# Patient Record
Sex: Female | Born: 1950 | Race: White | Hispanic: No | Marital: Married | Smoking: Never smoker
Health system: Southern US, Community
[De-identification: ages and names within clinical notes are randomized; demographics above are authoritative.]

## PROBLEM LIST (undated history)

## (undated) DIAGNOSIS — E039 Hypothyroidism, unspecified: Secondary | ICD-10-CM

## (undated) DIAGNOSIS — T783XXA Angioneurotic edema, initial encounter: Secondary | ICD-10-CM

## (undated) DIAGNOSIS — L501 Idiopathic urticaria: Secondary | ICD-10-CM

## (undated) DIAGNOSIS — I2699 Other pulmonary embolism without acute cor pulmonale: Secondary | ICD-10-CM

## (undated) DIAGNOSIS — F419 Anxiety disorder, unspecified: Secondary | ICD-10-CM

## (undated) DIAGNOSIS — C8190 Hodgkin lymphoma, unspecified, unspecified site: Secondary | ICD-10-CM

## (undated) DIAGNOSIS — I82409 Acute embolism and thrombosis of unspecified deep veins of unspecified lower extremity: Secondary | ICD-10-CM

## (undated) DIAGNOSIS — K760 Fatty (change of) liver, not elsewhere classified: Secondary | ICD-10-CM

## (undated) HISTORY — PX: APPENDECTOMY: SHX54

## (undated) HISTORY — DX: Acute embolism and thrombosis of unspecified deep veins of unspecified lower extremity: I82.409

## (undated) HISTORY — DX: Fatty (change of) liver, not elsewhere classified: K76.0

## (undated) HISTORY — DX: Idiopathic urticaria: L50.1

## (undated) HISTORY — DX: Other pulmonary embolism without acute cor pulmonale: I26.99

## (undated) HISTORY — DX: Angioneurotic edema, initial encounter: T78.3XXA

## (undated) HISTORY — PX: HERNIA REPAIR: SHX51

## (undated) HISTORY — DX: Hypothyroidism, unspecified: E03.9

## (undated) HISTORY — PX: SPLENECTOMY: SUR1306

## (undated) HISTORY — DX: Anxiety disorder, unspecified: F41.9

## (undated) HISTORY — DX: Hodgkin lymphoma, unspecified, unspecified site: C81.90

---

## 1983-03-01 HISTORY — PX: TUBAL LIGATION: SHX77

## 2004-06-29 ENCOUNTER — Inpatient Hospital Stay (HOSPITAL_COMMUNITY): Admission: EM | Admit: 2004-06-29 | Discharge: 2004-07-05 | Payer: Self-pay | Admitting: Emergency Medicine

## 2005-05-05 ENCOUNTER — Encounter: Admission: RE | Admit: 2005-05-05 | Discharge: 2005-05-05 | Payer: Self-pay | Admitting: Obstetrics and Gynecology

## 2006-01-23 ENCOUNTER — Other Ambulatory Visit: Admission: RE | Admit: 2006-01-23 | Discharge: 2006-01-23 | Payer: Self-pay | Admitting: Obstetrics & Gynecology

## 2006-03-09 ENCOUNTER — Encounter: Admission: RE | Admit: 2006-03-09 | Discharge: 2006-03-09 | Payer: Self-pay | Admitting: Cardiology

## 2006-03-14 ENCOUNTER — Ambulatory Visit (HOSPITAL_COMMUNITY): Admission: RE | Admit: 2006-03-14 | Discharge: 2006-03-14 | Payer: Self-pay | Admitting: Cardiology

## 2006-03-24 ENCOUNTER — Ambulatory Visit (HOSPITAL_COMMUNITY): Admission: RE | Admit: 2006-03-24 | Discharge: 2006-03-24 | Payer: Self-pay | Admitting: Cardiology

## 2006-05-08 ENCOUNTER — Encounter: Admission: RE | Admit: 2006-05-08 | Discharge: 2006-05-08 | Payer: Self-pay | Admitting: Obstetrics & Gynecology

## 2007-02-15 ENCOUNTER — Other Ambulatory Visit: Admission: RE | Admit: 2007-02-15 | Discharge: 2007-02-15 | Payer: Self-pay | Admitting: Obstetrics & Gynecology

## 2007-07-05 ENCOUNTER — Encounter: Admission: RE | Admit: 2007-07-05 | Discharge: 2007-07-05 | Payer: Self-pay | Admitting: Obstetrics & Gynecology

## 2007-08-29 HISTORY — PX: MELANOMA EXCISION: SHX5266

## 2008-03-31 ENCOUNTER — Other Ambulatory Visit: Admission: RE | Admit: 2008-03-31 | Discharge: 2008-03-31 | Payer: Self-pay | Admitting: Obstetrics & Gynecology

## 2008-07-14 ENCOUNTER — Encounter: Admission: RE | Admit: 2008-07-14 | Discharge: 2008-07-14 | Payer: Self-pay | Admitting: Obstetrics & Gynecology

## 2009-08-07 ENCOUNTER — Encounter: Admission: RE | Admit: 2009-08-07 | Discharge: 2009-08-07 | Payer: Self-pay | Admitting: Obstetrics & Gynecology

## 2009-08-25 ENCOUNTER — Encounter: Admission: RE | Admit: 2009-08-25 | Discharge: 2009-08-25 | Payer: Self-pay | Admitting: Obstetrics & Gynecology

## 2010-02-02 IMAGING — MG MM SCREEN MAMMOGRAM BILATERAL
4 series · 4 of 4 positions shown · non-contrast
Comparison: none

DG SCREEN MAMMOGRAM BILATERAL
Bilateral CC and MLO view(s) were taken.

DIGITAL SCREENING MAMMOGRAM WITH CAD:
The breast tissue is almost entirely fatty.  No masses or malignant type calcifications are 
identified.  Compared with prior studies.

[R CC]
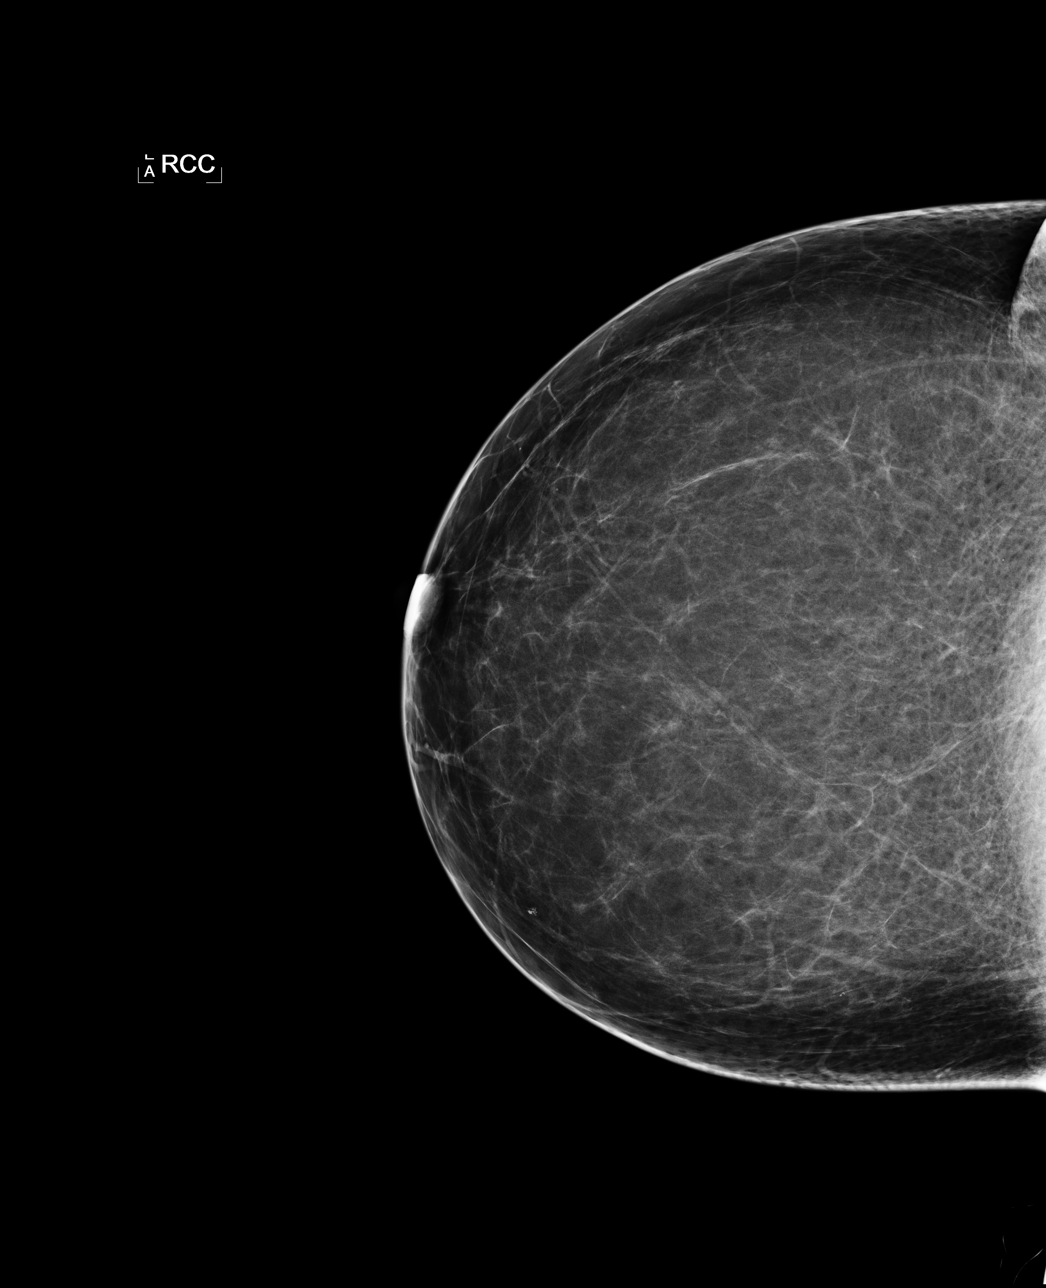

[L CC]
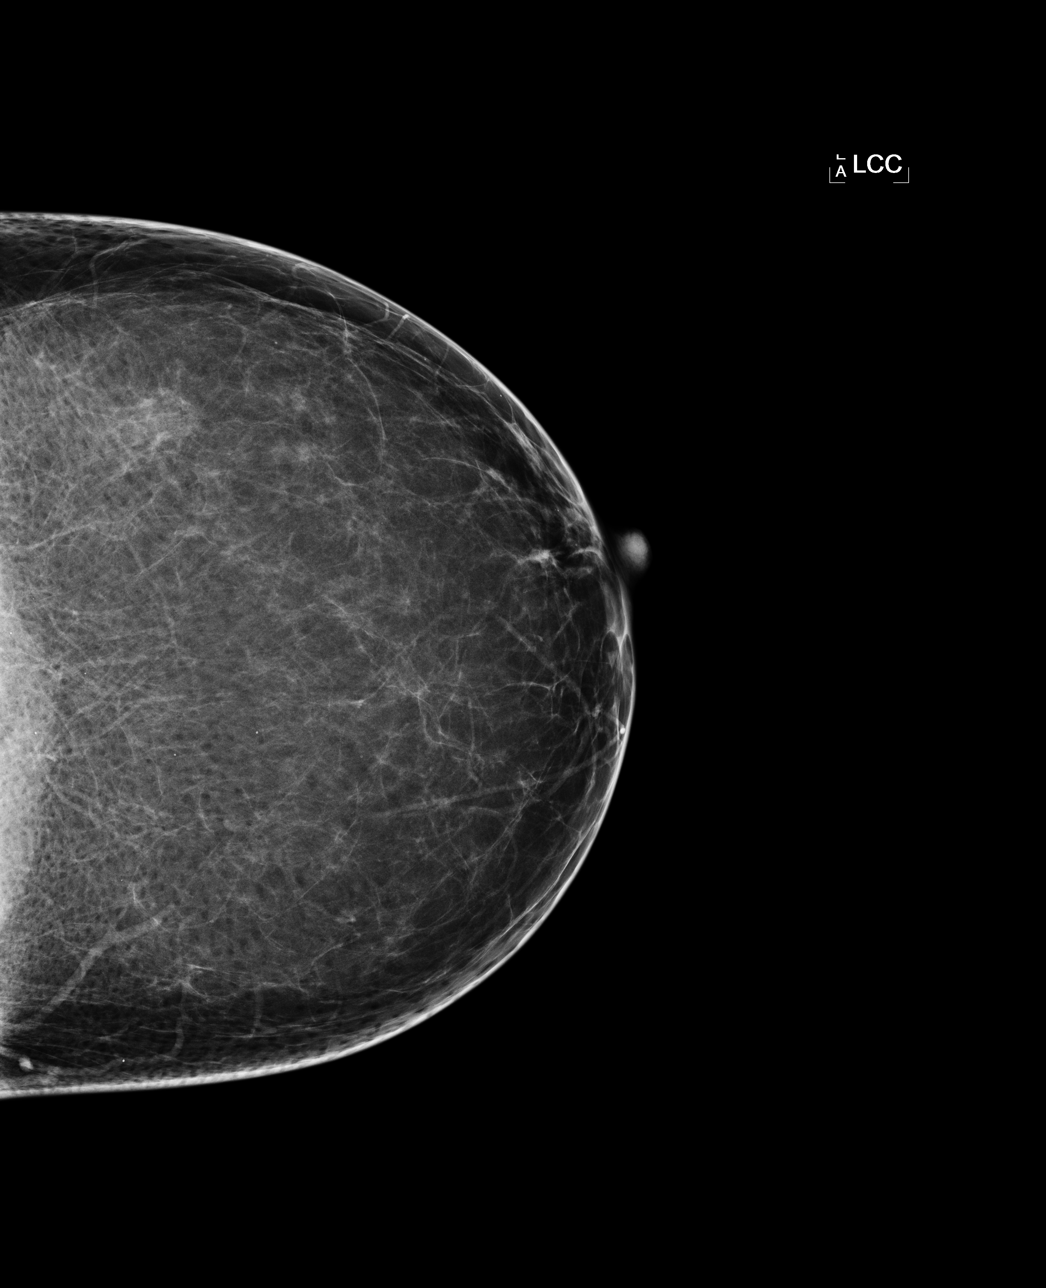

[L MLO]
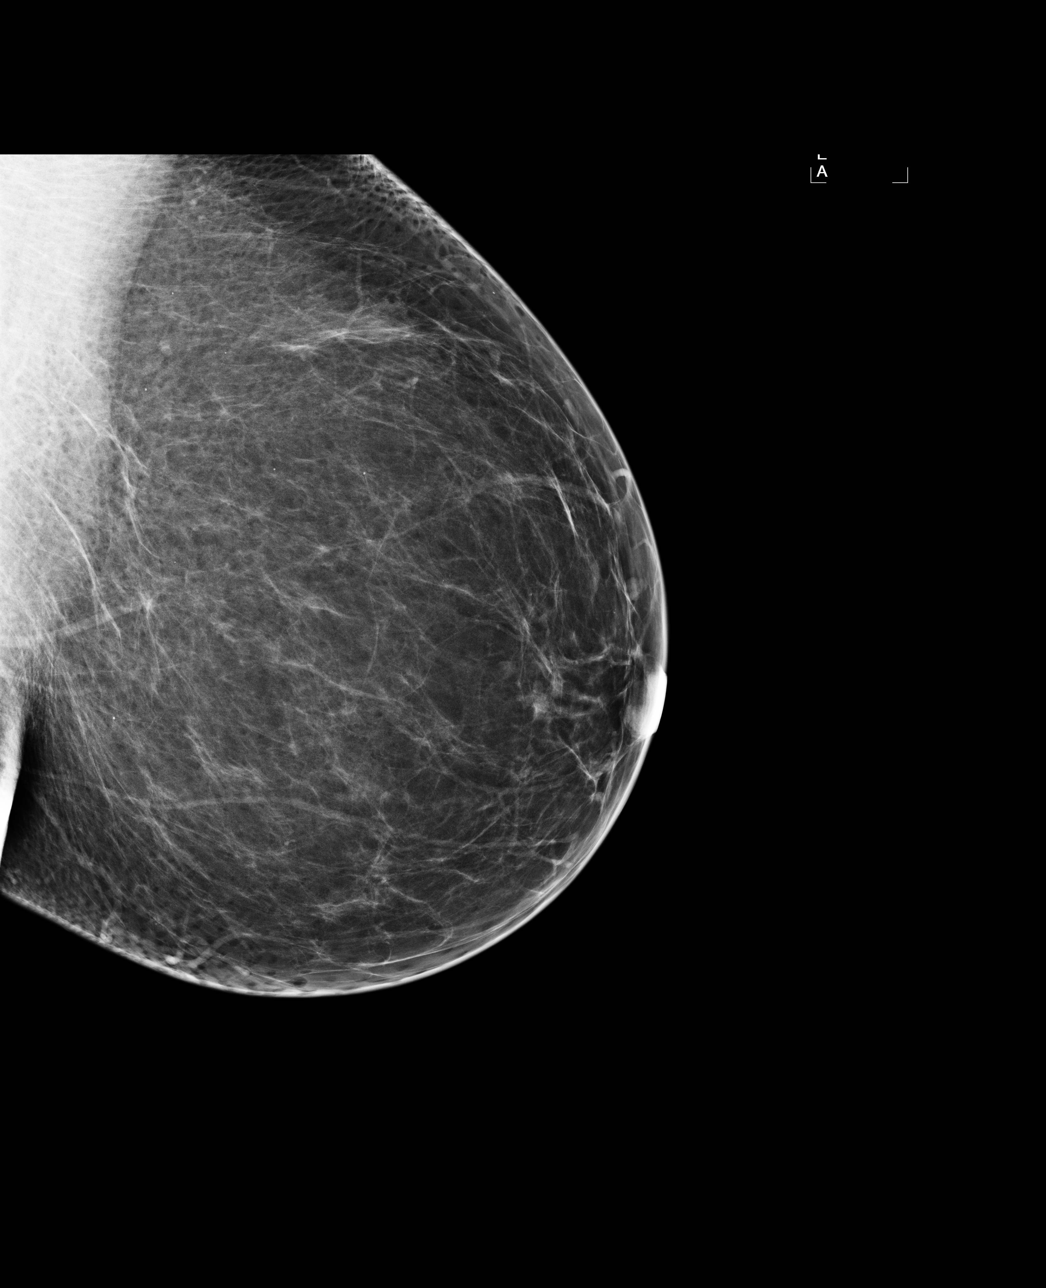

[R MLO]
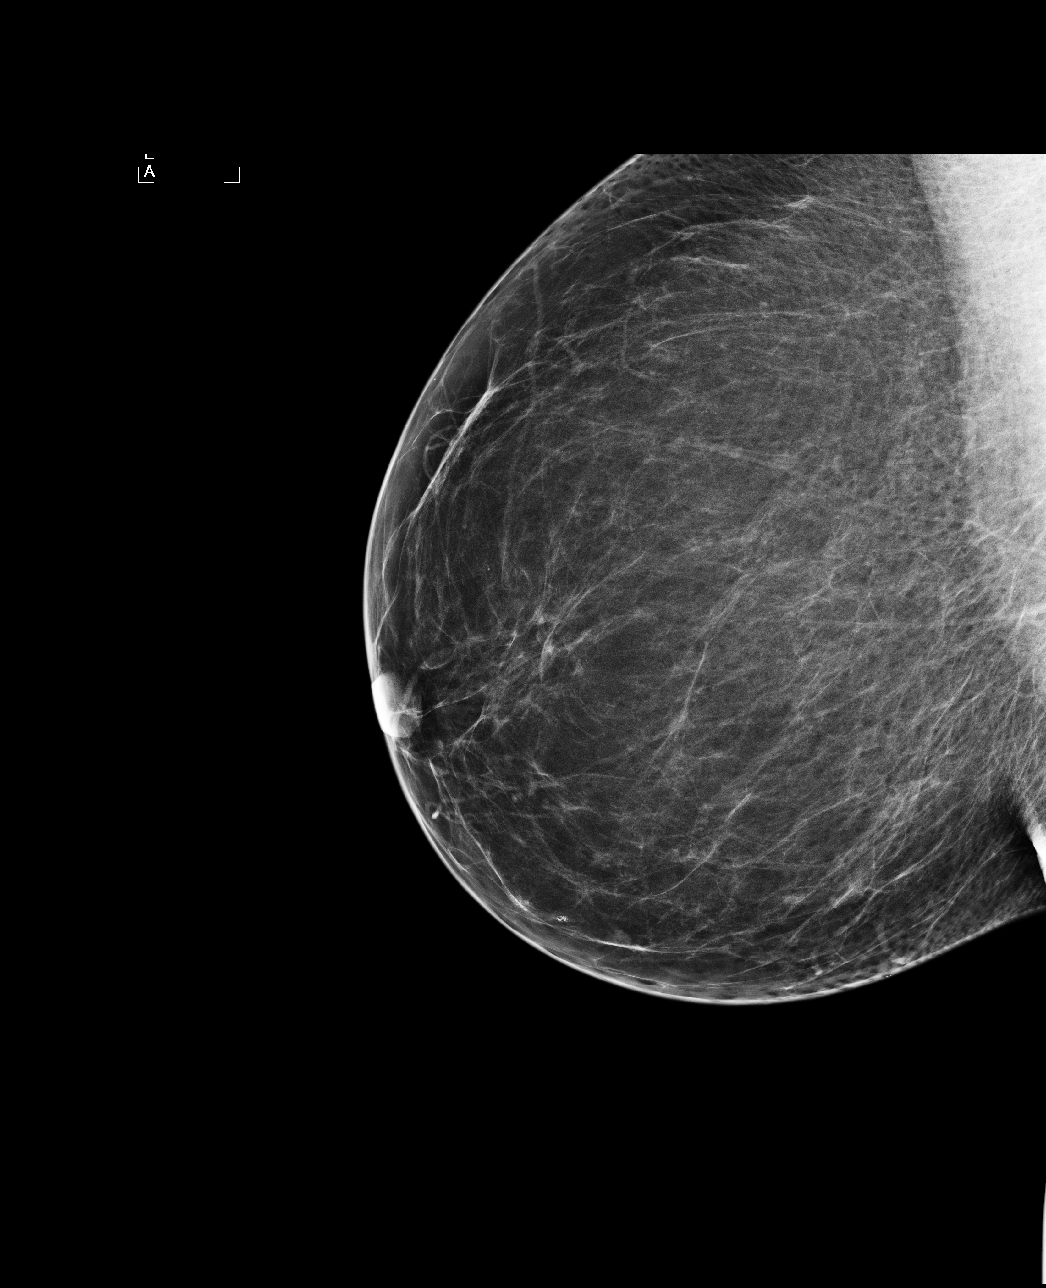

[4 of 4 positions shown; findings below may reference images not displayed]

IMPRESSION: No specific mammographic evidence of malignancy.  Next screening mammogram is recommended in one 
year.

ASSESSMENT: Negative - BI-RADS 1

Screening mammogram in 1 year.
ANALYZED BY COMPUTER AIDED DETECTION. , THIS PROCEDURE WAS A DIGITAL MAMMOGRAM.

## 2010-03-21 ENCOUNTER — Encounter: Payer: Self-pay | Admitting: Obstetrics & Gynecology

## 2010-07-16 NOTE — H&P (Signed)
Cynthia Freeman NO.:  [id number]   MEDICAL RECORD NO.:  [id number]          PATIENT TYPE:  INP   LOCATION:  0101                         FACILITY:  [facility]   PHYSICIAN:  Gertha Calkin, M.D.DATE OF BIRTH:  1950-07-16   DATE OF ADMISSION:  06/29/2004  DATE OF DISCHARGE:                                HISTORY & PHYSICAL   PRIMARY CARE PHYSICIAN:  Dr. Newman Pies, [place], [place].   ONCOLOGIST:  Dr. Laverna Peace in [place], [facility].   HISTORY OF PRESENT ILLNESS:  This is a pleasant 60 year old Caucasian female  with a history of Hodgkin cancer (remote) and hypothyroidism who is taking  hormone replacement therapy for the past few years for postmenopausal  symptoms who presents with a three day history of worsening shortness of  breath.  She also claims chest tightness during these few days as well.  Today while she was having an interview for her job, she had severe  respiratory distress associated with walking up a flight of stairs.  She  denies hematemesis, hemoptysis, no trauma or sedentary state.  Denies any  long trips.  No history of blood clots or PE in the family history.   PAST MEDICAL HISTORY:  1.  Hodgkin carcinoma, status post splenectomy.  2.  Hypothyroidism.  3.  Splenectomy in 1986.  4.  Two C-sections.  5.  Appendectomy, October 2004.  6.  Umbilical hernia repair - remote.   MEDICATIONS:  Synthroid, unknown dose  Activella - one tablet p.o. every day.   ALLERGIES:  DIFLUCAN causes a rash.   FAMILY HISTORY:  Positive for breast cancer in her father's side, lots of  her aunts.  Otherwise no history of coronary artery disease, hypertension,  kidney disease, diabetes.  No history of thromboembolism.   SOCIAL HISTORY:  The patient is married, has three living kids, one died  spontaneously at birth.  It should be noted that she has had several  spontaneous abortions initially when trying to conceive (approximately six).  She  denies any tobacco, alcohol, or illicit drug abuse.  She does  occasionally have wine.  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  She is trying to transition to [facility] for a similar position  in a different company.   REVIEW OF SYSTEMS:  Some increased weight gain over the last few years  secondary to change in lifestyle and some slight bit of depression.  She  denies any easy tearing or crying spells.  Otherwise, she denies any blurred  vision, headaches, cough, congestion, fevers, chills, abdominal pain, no  constipation, diarrhea, melanotic stools, hematochezia.   It should be noted that she has had screening colonoscopies and Cardiolite  both of which were negative.   PHYSICAL EXAMINATION:  VITAL SIGNS:  Temperature is 97.8, blood pressure  141/77, pulse 81, respirations 20, 100% on room air.  GENERAL:  This is a slightly obese, Caucasian female in slight distress  lying in bed.  HEENT:  Unremarkable.  NECK:  Supple without masses, JVP,  bruits.  CHEST:  Clear to auscultation bilaterally with good air movement.  CARDIOVASCULAR:  Regular rate and rhythm.  No murmurs, rubs or gallops.  ABDOMEN:  Obese, nontender, nondistended.  Positive bowel sounds.  No  hepatosplenomegaly.  No flank tenderness.  No rebound, rigidity, guarding.  EXTREMITIES:  Without clubbing, cyanosis, or edema.  She is slightly tender  in the left ankle (she had recently sprained her ankle).  NEUROLOGIC:  Cranial nerves II-XII are intact.  There are no gross motor or  sensation deficits.  She is alert and oriented x 3.   LABS:  CT chest with contrast shows bilateral moderate PE, left greater than  right.  Her white count is 22.8, hemoglobin 15.2, platelets 380, MCV of 94.  INR of 1.1, PTT of 32.  Sodium 139, potassium 5, chloride 108, bicarb 25,  glucose of 106, BUN of 9, creatinine of 0.8.  Her LFTs are normal.   Point-of-  care markers x 2 are negative.  EKG shows sinus tachycardia, otherwise no  axis or interval abnormalities.   ASSESSMENT:  1.  Bilateral pulmonary embolism.  2.  Hypothyroidism.  3.  History of Hodgkin carcinoma.  4.  Status post splenectomy.  5.  Gastrointestinal prophylaxis.   PLAN:  1.  We will admit to a tele bed and initiate anticoagulation with Lovenox      and Coumadin.  The patient has good insurance and is well educated and      prefers to have outpatient therapy once outpatient treatment is      coordinated since she is from out of town.  2.  During this admission, we will check a TSH.  3.  Since she has a history of spontaneous abortions, we will check for      underlying etiology of hypercoagulable states, therefore, we will check      a factor V Leiden, antithrombin III levels, as well as a gene mutation      for prothrombin 16109.  Also check antiphospholipid and homocystine      levels.  4.  We will give her GI prophylaxis Protonix.  5.  Place her on 2 liters O2 nasal cannula to O2 saturations greater than      95%.  6.  We will get case management involved to help coordinate outpatient      followup with her primary care physician in [place].      JD/MEDQ  D:  06/29/2004  T:  06/29/2004  Job:  [id number]

## 2010-07-16 NOTE — Cardiovascular Report (Signed)
Cynthia Freeman, Cynthia Freeman              ACCOUNT NO.:  [id number]   MEDICAL RECORD NO.:  [id number]          PATIENT TYPE:  OIB   LOCATION:  NA                           FACILITY:  [facility]   PHYSICIAN:  Armanda Magic, M.D.     DATE OF BIRTH:  05/11/1950   DATE OF PROCEDURE:  03/13/2006  DATE OF DISCHARGE:                            CARDIAC CATHETERIZATION   PROCEDURE:  Left heart catheterization, coronary angiography, left  ventriculography.   OPERATOR:  Armanda Magic, M.D.   INDICATIONS:  PVCs and abnormal Cardiolite.   COMPLICATIONS:  None.   IV ACCESS:  Via right femoral artery, 6-French sheath.   This is a very pleasant 60 year old white female with a history of  Hodgkin's lymphoma who presents now with frequent PVCs.  She is status  post stress Cardiolite study which showed a reversible defect in the  anterior wall at the base of the heart.  She now presents for cardiac  catheterization.   The patient is brought to cardiac catheterization laboratory in a  fasting nonsedated state.  Informed consent was obtained.  The patient  was connected to continuous heart rate and pulse oximetry monitoring and  intermittent blood pressure monitoring.  The right groin was prepped and  draped in sterile fashion.  1% Xylocaine was used for local anesthesia.  Using the modified Seldinger technique, a 6-French sheath was placed in  the right femoral artery.  Under fluoroscopic guidance, a 6-French JL-4  catheter was placed in the left coronary artery.  Multiple cine films  were taken in the 30-degree RAO, 40-degree LAO views.  This catheter was  then exchanged out over a guidewire for a 6-French JR-4 catheter which  was placed under fluoroscopic guidance in the right coronary artery.  Multiple cine films were taken in the 30-degree RAO, 40-degree LAO  views.  This catheter was then exchanged out over a guidewire for 6-  French angled pigtail catheter which was placed under fluoroscopic  guidance  in the left ventricular cavity.  Left ventriculography was  performed in the 30-degree RAO view using a total of 30 mL of contrast  at 15 mL per second.  The catheter was then pulled back across the  aortic valve with no significant gradient noted.  At the end of the  procedure, all catheters and sheaths were removed.  Manual compression  was performed until adequate hemostasis was obtained.  The patient was  transferred back to her room in stable condition.   RESULTS:  The left main coronary artery is widely patent and trifurcates  into a left anterior descending artery, ramus branch, and left  circumflex artery.  The left anterior descending artery is widely patent  throughout its course, with the apex giving rise to a first diagonal  branch which is widely patent.  The ramus is also widely patent.  The  left circumflex traverses the AV groove and gives rise two obtuse  marginal branches, both of which are widely patent.   The right coronary is widely patent throughout its course bifurcating  into a posterior descending artery and posterolateral artery.   Left  ventriculography shows normal LV systolic function, EF 65%, LVEDP  14 mmHg, LV pressure 125/6 mmHg, aortic pressure 137/77 mmHg.   ASSESSMENT:  1. Frequent premature ventricular contractions, symptomatic, but the      patient does not wish to go on any medications for suppression  at      this time.  She wants to see how she does knowing that her heart      otherwise is normal.  2. Normal coronary arteries.  3. Normal left ventricular function.   PLAN:  Discharge to home after IV fluid.  Bedrest.  Follow up with me in  1 week.      Armanda Magic, M.D.  Electronically Signed     TT/MEDQ  D:  03/13/2006  T:  03/13/2006  Job:  [id number]   cc:   Georgianne Fick, M.D.

## 2010-07-16 NOTE — Discharge Summary (Signed)
Cynthia Freeman, Cynthia Freeman              ACCOUNT NO.:  [id number]   MEDICAL RECORD NO.:  [id number]          PATIENT TYPE:  INP   LOCATION:  0380                         [facility]ACILITY:  [facility]   PHYSICIAN:  Cynthia Shirk, MD     DATE OF BIRTH:  [birth year]   DATE OF ADMISSION:  06/29/2004  DATE OF DISCHARGE:  07/05/2004                                 DISCHARGE SUMMARY   DISCHARGE DIAGNOSES:  1.  Bilateral pulmonary emboli.  2.  Hypothyroidism.   MEDICATIONS ON DISCHARGE:  1.  Coumadin 10 mg p.o. daily.  2.  Lovenox 80 mg subcutaneously q.12 h. x2 days.  3.  Synthroid 75 mcg p.o. daily.   FOLLOW-UP APPOINTMENT:  With Dr. Nicholos Freeman on Jul 08, 2004 at 10:30 a.m.,  at which time the patient a PT/INR will be checked, Coumadin dose adjusted,  and further prescriptions for Coumadin written.   HISTORY OF PRESENT ILLNESS:  Cynthia Freeman is a very pleasant Caucasian woman  who was in [place] interviewing for a job when she noticed shortness of  breath, and it was recommended that she come to the ED for evaluation.  The  patient has a history of Hodgkin's lymphoma and also hypothyroidism.  The  patient was also taking hormone replacement therapy for the past few years  for postmenopausal symptoms.  The patient states that she had this shortness  of breath for about three days.  She also claimed chest tightness during  these days.  No hematemesis, hemoptysis.  No trauma or sedentary state.  The  patient denies any long trips.  No history of blood clots or PE in the  family.  The patient state that she has had one miscarriage and has had one  pregnancy with placenta previa.  It is unclear at this time if the  etiologies for these events have been delineated.   PAST MEDICAL HISTORY:  1.  Hodgkin's lymphoma, status post splenectomy.  2.  Hypothyroidism.  3.  Appendectomy in 2004.  4.  Splenectomy in 1986.  5.  Status post two C-sections.  6.  Umbilical hernia repair.   MEDICATIONS ON  ADMISSION:  1.  Synthroid 75 mcg p.o. daily.  2.  Activella 1 tablet p.o. daily.   ALLERGIES:  DIFLUCAN causes a rash.   PHYSICAL EXAMINATION ON ADMISSION:  VITAL SIGNS:  Blood pressure 141/77;  pulse 81; respirations 20; saturations 100% on room air.  GENERAL:  Mildly obese Caucasian woman in slight distress, lying in bed.  HEENT:  Normocephalic, atraumatic.  PERRL.  Sclerae anicteric.  Mucous  membranes moist.  NECK:  Supple.  No LAD, no JVD.  LUNGS:  Clear to auscultation bilaterally.  No wheezes, no rales.  CARDIOVASCULAR:  S1 plus S2.  Regular rate and rhythm.  No murmurs, rubs, or  gallops.  ABDOMEN:  Mildly obese.  Soft, no tenderness, no distention.  Positive bowel  sounds.  No organomegaly.  No rebound, rigidity, or guarding.  EXTREMITIES:  No cyanosis, clubbing, or edema.  Mild tenderness on the left  lateral ankle.  Status post sprain.  NEUROLOGIC:  Nonfocal.  LABORATORIES:  CT chest PE protocol, showed bilateral moderate PE, left  greater than right.  WBC 22.8, hemoglobin 15.2, __________ 30, MCV 94.  INR  1.1, PTT 32.  Sodium 139, potassium 5, chloride 108, bicarbonate 25, glucose  106, BUN 9, creatinine 0.8.  LFTs within normal limits.  Point of care  cardiac enzymes x 2 negative.  EKG:  Sinus tachycardia, otherwise normal.   HOSPITAL COURSE:  1.  The patient was admitted to a telemetry bed.  She was started on Lovenox      90 mg subcutaneously q.12 h.  She was also started on Coumadin 10 mg      p.o. daily.  The patient's INR rose slowly.  On day of discharge,      patient's INR was 2.3, with a PT of 20.4.  The patient will be continued      on Lovenox on Jul 05, 2004 and Jul 06, 2004 for the two-day overlap after      INR is therapeutic.  The patient has been educated on self-      administration of Lovenox, observed by R.N. to do this correctly.      Patient comfortable with the injection.  She will be followed up by Dr.      Nicholos Freeman on Thursday Jul 08, 2004,  at which time a PT/INR will be      checked and Coumadin dose appropriately adjusted.  It is unclear at this      time how long the patient will need to be anticoagulated.  It appears      that this may be a provoked event in light of the patient being on      hormone replacement therapy for postmenopausal symptoms.  However, the      etiologies of her prior miscarriage and placenta previa have not been      delineated.  The patient will provide records from her ob/gyn and      primary care physician from [place] to Dr. Nicholos Freeman over the next      few weeks, and the decision can be made as to how long the patient needs      to be anticoagulated.  Antiphospholipid antibodies were negative.      Factor V mutation test was also negative.  Antithrombin III levels were      within normal limits.  2.  Hypothyroidism.  The patient's Synthroid was resumed at home dose of 75      mcg p.o. daily.  Her TSH level was 3.5151, and hence her dosage has not      been changed.   The patient was advised to keep her follow-up appointments.  She was also  given information about Coumadin and avoidance of certain foods.  The  patient verbalized understanding of these instructions.   She was advised to return to the emergency department immediately upon onset  of chest pain, shortness of breath, or any other symptoms that may need  medical attention.      GDK/MEDQ  D:  07/05/2004  T:  07/05/2004  Job:  [id number]   cc:   Cynthia Freeman, M.D.  [address]  [place]  [place] [address]  Fax: [phone]

## 2010-09-13 ENCOUNTER — Other Ambulatory Visit: Payer: Self-pay | Admitting: Internal Medicine

## 2010-09-13 DIAGNOSIS — R1011 Right upper quadrant pain: Secondary | ICD-10-CM

## 2010-09-14 ENCOUNTER — Ambulatory Visit
Admission: RE | Admit: 2010-09-14 | Discharge: 2010-09-14 | Disposition: A | Discharge status: Home / Self Care | Payer: BC Managed Care – PPO | Source: Ambulatory Visit | Attending: Internal Medicine | Admitting: Internal Medicine

## 2010-09-14 DIAGNOSIS — R1011 Right upper quadrant pain: Secondary | ICD-10-CM

## 2010-09-14 DIAGNOSIS — K760 Fatty (change of) liver, not elsewhere classified: Secondary | ICD-10-CM

## 2010-09-14 HISTORY — DX: Fatty (change of) liver, not elsewhere classified: K76.0

## 2010-10-06 ENCOUNTER — Other Ambulatory Visit: Payer: Self-pay | Admitting: Obstetrics & Gynecology

## 2010-10-06 DIAGNOSIS — Z1231 Encounter for screening mammogram for malignant neoplasm of breast: Secondary | ICD-10-CM

## 2010-10-14 ENCOUNTER — Ambulatory Visit: Payer: BC Managed Care – PPO

## 2010-10-21 ENCOUNTER — Ambulatory Visit: Payer: BC Managed Care – PPO

## 2010-11-29 HISTORY — PX: RETINAL DETACHMENT SURGERY: SHX105

## 2010-12-02 DIAGNOSIS — H43819 Vitreous degeneration, unspecified eye: Secondary | ICD-10-CM | POA: Insufficient documentation

## 2011-03-18 ENCOUNTER — Ambulatory Visit: Payer: BC Managed Care – PPO

## 2011-03-30 ENCOUNTER — Ambulatory Visit: Payer: BC Managed Care – PPO

## 2011-04-11 ENCOUNTER — Ambulatory Visit: Payer: BC Managed Care – PPO

## 2011-04-14 ENCOUNTER — Ambulatory Visit
Admission: RE | Admit: 2011-04-14 | Discharge: 2011-04-14 | Disposition: A | Discharge status: Home / Self Care | Payer: BC Managed Care – PPO | Source: Ambulatory Visit | Attending: Obstetrics & Gynecology | Admitting: Obstetrics & Gynecology

## 2011-04-14 DIAGNOSIS — Z1231 Encounter for screening mammogram for malignant neoplasm of breast: Secondary | ICD-10-CM

## 2012-04-20 ENCOUNTER — Other Ambulatory Visit: Payer: Self-pay | Admitting: Obstetrics & Gynecology

## 2012-04-20 DIAGNOSIS — Z1231 Encounter for screening mammogram for malignant neoplasm of breast: Secondary | ICD-10-CM

## 2012-05-11 ENCOUNTER — Ambulatory Visit
Admission: RE | Admit: 2012-05-11 | Discharge: 2012-05-11 | Disposition: A | Discharge status: Home / Self Care | Payer: BC Managed Care – PPO | Source: Ambulatory Visit | Attending: Obstetrics & Gynecology | Admitting: Obstetrics & Gynecology

## 2012-05-11 DIAGNOSIS — Z1231 Encounter for screening mammogram for malignant neoplasm of breast: Secondary | ICD-10-CM

## 2012-07-17 ENCOUNTER — Encounter: Payer: Self-pay | Admitting: Internal Medicine

## 2012-08-24 ENCOUNTER — Encounter: Payer: Self-pay | Admitting: *Deleted

## 2012-09-03 ENCOUNTER — Encounter: Payer: Self-pay | Admitting: Obstetrics & Gynecology

## 2012-09-06 ENCOUNTER — Ambulatory Visit: Payer: Self-pay | Admitting: Obstetrics & Gynecology

## 2012-09-14 ENCOUNTER — Ambulatory Visit: Payer: BC Managed Care – PPO | Admitting: Internal Medicine

## 2012-09-25 ENCOUNTER — Ambulatory Visit: Payer: BC Managed Care – PPO | Admitting: Internal Medicine

## 2012-11-08 ENCOUNTER — Encounter: Payer: Self-pay | Admitting: Obstetrics & Gynecology

## 2012-11-23 ENCOUNTER — Ambulatory Visit: Payer: Self-pay | Admitting: Obstetrics & Gynecology

## 2012-11-29 ENCOUNTER — Ambulatory Visit: Payer: Self-pay | Admitting: Obstetrics & Gynecology

## 2012-12-18 ENCOUNTER — Encounter: Payer: Self-pay | Admitting: Obstetrics & Gynecology

## 2012-12-18 ENCOUNTER — Ambulatory Visit (INDEPENDENT_AMBULATORY_CARE_PROVIDER_SITE_OTHER): Payer: BC Managed Care – PPO | Admitting: Obstetrics & Gynecology

## 2012-12-18 VITALS — BP 128/86 | HR 60 | Resp 16 | Ht 61.0 in | Wt 171.4 lb

## 2012-12-18 DIAGNOSIS — Z1211 Encounter for screening for malignant neoplasm of colon: Secondary | ICD-10-CM

## 2012-12-18 DIAGNOSIS — Z01419 Encounter for gynecological examination (general) (routine) without abnormal findings: Secondary | ICD-10-CM

## 2012-12-18 DIAGNOSIS — Z86718 Personal history of other venous thrombosis and embolism: Secondary | ICD-10-CM | POA: Insufficient documentation

## 2012-12-18 NOTE — Progress Notes (Addendum)
62 y.o. [id number] MarriedCaucasianF here for annual exam.  Building a house at [facility].  Moved three times this year--first when house here sold, second with daughter for awhile in [place], and now in [place] in mother's home after mother moved into [employer].  Mother diagnosed with auto-immune disorder this year.    No vaginal bleeding.  Patient working on weight loss as her husband was diagnosed with diabetes in the summer.  He is down 40 pounds.  Has seen Dr. Nicholos Johns earlier this year.  Blood sugar was mildly elevated.  Patient has IBS symptoms.  Stool sample done with PCP was + for bacteria.  On antibiotics for a couple of weeks.  Repeat culture was negative.  Has been seen at [facility].  Has follow-up scheduled tomorrow.  Husband turns 60 this week.  Planning a big, 150 person, party for him.     No LMP recorded. Patient is postmenopausal.          Sexually active: yes  The current method of family planning is none.    Exercising: yes  weights and cardio Smoker:  no  Health Maintenance: Pap:  08/11/11 WNL/negative HR HPV History of abnormal Pap:  no MMG:  05/11/12 normal Colonoscopy:  2005-due this year, last was in [place] BMD:   2013 (-1.5/-1.3) TDaP:  2009  Screening Labs: PCP, Hb today: PCP, Urine today: PCP   reports that she has never smoked. She has never used smokeless tobacco. She reports that she drinks alcohol. She reports that she does not use illicit drugs.  Past Medical History  Diagnosis Date  . Anxiety   . Pulmonary embolism   . Fatty liver 09/14/10  . Angioedema   . Hodgkin disease   . Hypothyroidism   . DVT (deep venous thrombosis)     With PE (on hrt)    Past Surgical History  Procedure Laterality Date  . Appendectomy    . Splenectomy    . Cesarean section  1981  . Hernia repair  Age 57  . Tubal ligation  1985  . Melanoma excision  08/2007  . Retinal detachment surgery  10/12    Current Outpatient Prescriptions  Medication  Sig Dispense Refill  . aspirin 81 MG tablet Take 81 mg by mouth daily.      . BUSPIRONE HCL PO Take by mouth.      . Cetirizine HCl (ZYRTEC PO) Take by mouth.      . levothyroxine (SYNTHROID, LEVOTHROID) 50 MCG tablet Take 50 mcg by mouth daily before breakfast.      . Multiple Vitamins-Minerals (MULTIVITAMIN PO) Take by mouth.      . Ranitidine HCl (ZANTAC PO) Take by mouth 2 (two) times daily.      . Naproxen Sodium (ALEVE PO) Take by mouth as needed.       No current facility-administered medications for this visit.    Family History  Problem Relation Age of Onset  . Uterine cancer Mother   . Breast cancer Paternal Aunt   . Breast cancer Paternal Aunt   . Breast cancer Paternal Aunt   . Autoimmune disease Mother     ROS:  Pertinent items are noted in HPI.  Otherwise, a comprehensive ROS was negative.  Exam:   BP 128/86  Pulse 60  Resp 16  Ht 5\' 1"  (1.549 m)  Wt 171 lb 6.4 oz (77.747 kg)  BMI 32.4 kg/m2  Weight change: -4lbs  Height: 5\' 1"  (154.9 cm)  Ht  Readings from Last 3 Encounters:  12/18/12 5\' 1"  (1.549 m)    General appearance: alert, cooperative and appears stated age Head: Normocephalic, without obvious abnormality, atraumatic Neck: no adenopathy, supple, symmetrical, trachea midline and thyroid normal to inspection and palpation Lungs: clear to auscultation bilaterally Breasts: normal appearance, no masses or tenderness Heart: regular rate and rhythm Abdomen: soft, non-tender; bowel sounds normal; no masses,  no organomegaly Extremities: extremities normal, atraumatic, no cyanosis or edema Skin: Skin color, texture, turgor normal. No rashes or lesions Lymph nodes: Cervical, supraclavicular, and axillary nodes normal. No abnormal inguinal nodes palpated Neurologic: Grossly normal   Pelvic: External genitalia:  no lesions              Urethra:  normal appearing urethra with no masses, tenderness or lesions              Bartholins and Skenes: normal                  Vagina: normal appearing vagina with normal color and discharge, no lesions              Cervix: no lesions              Pap taken: no Bimanual Exam:  Uterus:  normal size, contour, position, consistency, mobility, non-tender              Adnexa: normal adnexa and no mass, fullness, tenderness               Rectovaginal: Confirms               Anus:  normal sphincter tone, no lesions  A:  Well Woman with normal exam PMP, No HRT H/O DVT Hypothyroidism IBS H/O melanoma 7/09  P:   Mammogram yearly. pap smear with neg HR HPV.  No Pap today. All labs with PCP. Referral to Dr. Loreta Ave. return annually or prn  An After Visit Summary was printed and given to the patient.

## 2012-12-18 NOTE — Patient Instructions (Signed)

## 2013-02-26 ENCOUNTER — Other Ambulatory Visit: Payer: Self-pay | Admitting: Internal Medicine

## 2013-02-26 DIAGNOSIS — R1011 Right upper quadrant pain: Secondary | ICD-10-CM

## 2013-03-01 ENCOUNTER — Ambulatory Visit
Admission: RE | Admit: 2013-03-01 | Discharge: 2013-03-01 | Disposition: A | Discharge status: Home / Self Care | Payer: 59 | Source: Ambulatory Visit | Attending: Internal Medicine | Admitting: Internal Medicine

## 2013-03-01 DIAGNOSIS — R1011 Right upper quadrant pain: Secondary | ICD-10-CM

## 2013-04-04 ENCOUNTER — Other Ambulatory Visit: Payer: Self-pay | Admitting: Gastroenterology

## 2013-04-04 DIAGNOSIS — R1011 Right upper quadrant pain: Secondary | ICD-10-CM

## 2013-04-25 ENCOUNTER — Ambulatory Visit (HOSPITAL_COMMUNITY)
Admission: RE | Admit: 2013-04-25 | Discharge: 2013-04-25 | Disposition: A | Discharge status: Home / Self Care | Payer: 59 | Source: Ambulatory Visit | Attending: Gastroenterology | Admitting: Gastroenterology

## 2013-04-25 ENCOUNTER — Encounter (HOSPITAL_COMMUNITY): Payer: Self-pay

## 2013-04-25 DIAGNOSIS — R1011 Right upper quadrant pain: Secondary | ICD-10-CM | POA: Insufficient documentation

## 2013-04-25 MED ORDER — TECHNETIUM TC 99M MEBROFENIN IV KIT
5.0000 | PACK | Freq: Once | INTRAVENOUS | Status: AC | PRN
  Administered 2013-04-25: 5 via INTRAVENOUS

## 2013-06-10 ENCOUNTER — Other Ambulatory Visit: Payer: Self-pay

## 2013-06-10 DIAGNOSIS — Z1231 Encounter for screening mammogram for malignant neoplasm of breast: Secondary | ICD-10-CM

## 2013-07-03 ENCOUNTER — Ambulatory Visit: Payer: 59

## 2013-07-09 ENCOUNTER — Encounter (INDEPENDENT_AMBULATORY_CARE_PROVIDER_SITE_OTHER): Payer: Self-pay

## 2013-07-09 ENCOUNTER — Ambulatory Visit
Admission: RE | Admit: 2013-07-09 | Discharge: 2013-07-09 | Disposition: A | Discharge status: Home / Self Care | Payer: 59 | Source: Ambulatory Visit

## 2013-07-09 DIAGNOSIS — Z1231 Encounter for screening mammogram for malignant neoplasm of breast: Secondary | ICD-10-CM

## 2013-12-30 ENCOUNTER — Encounter (HOSPITAL_COMMUNITY): Payer: Self-pay

## 2014-01-09 ENCOUNTER — Ambulatory Visit: Payer: BC Managed Care – PPO | Admitting: Obstetrics & Gynecology

## 2014-02-26 ENCOUNTER — Encounter: Payer: Self-pay | Admitting: Certified Nurse Midwife

## 2014-02-26 ENCOUNTER — Ambulatory Visit (INDEPENDENT_AMBULATORY_CARE_PROVIDER_SITE_OTHER): Payer: 59 | Admitting: Certified Nurse Midwife

## 2014-02-26 VITALS — BP 120/70 | HR 82 | Resp 16 | Ht 61.5 in | Wt 172.8 lb

## 2014-02-26 DIAGNOSIS — Z0000 Encounter for general adult medical examination without abnormal findings: Secondary | ICD-10-CM

## 2014-02-26 DIAGNOSIS — Z124 Encounter for screening for malignant neoplasm of cervix: Secondary | ICD-10-CM

## 2014-02-26 DIAGNOSIS — N952 Postmenopausal atrophic vaginitis: Secondary | ICD-10-CM

## 2014-02-26 DIAGNOSIS — Z01419 Encounter for gynecological examination (general) (routine) without abnormal findings: Secondary | ICD-10-CM

## 2014-02-26 LAB — POCT URINALYSIS DIPSTICK
PH UA: 5
Urobilinogen, UA: NEGATIVE

## 2014-02-26 NOTE — Progress Notes (Signed)
63 y.o. G44P3003 Married Caucasian Fe here for annual exam. Menopausal no HRT. Denies vaginal bleeding. Patient is very sad without sexual activity, due dryness. Patient previous HRT use, developed DVT and stopped. Sees PCP for labs and ae, medication management for Hypothyroid. No health issues today except for vaginal dryness..   Patient's last menstrual period was 02/28/2001.          Sexually active: No.  The current method of family planning is post menopausal status.    Exercising: Yes.    Cardio, weight training  Smoker:  no  Health Maintenance: Pap:  08/11/11 NEG HR HPV  MMG:  07/09/13 Bi-Rads 1: Negative Colonoscopy:  2015- 1 small polyp BMD:   5/11 -1.5/-1.3 TDaP:  08/29/2007  Labs: Hgb: 13.9 ; [place] +   reports that she has never smoked. She has never used smokeless tobacco. She reports that she drinks alcohol. She reports that she does not use illicit drugs.  Past Medical History  Diagnosis Date  . Anxiety   . Pulmonary embolism   . Fatty liver 09/14/10  . Angioedema   . Hodgkin disease   . Hypothyroidism   . DVT (deep venous thrombosis)     With PE (on hrt)    Past Surgical History  Procedure Laterality Date  . Appendectomy    . Splenectomy    . Cesarean section  1981  . Hernia repair  Age 71  . Tubal ligation  1985  . Melanoma excision  08/2007  . Retinal detachment surgery  10/12    Current Outpatient Prescriptions  Medication Sig Dispense Refill  . Cetirizine HCl (ZYRTEC PO) Take by mouth.    . levothyroxine (SYNTHROID, LEVOTHROID) 50 MCG tablet Take 50 mcg by mouth daily before breakfast.    . Multiple Vitamins-Minerals (MULTIVITAMIN PO) Take by mouth.    . Ranitidine HCl (ZANTAC PO) Take by mouth 2 (two) times daily.     No current facility-administered medications for this visit.    Family History  Problem Relation Age of Onset  . Uterine cancer Mother   . Breast cancer Paternal Aunt   . Breast cancer Paternal Aunt   . Breast cancer Paternal Aunt    . Autoimmune disease Mother     ROS:  Pertinent items are noted in HPI.  Otherwise, a comprehensive ROS was negative.  Exam:   BP 120/70 mmHg  Pulse 82  Resp 16  Ht 5' 1.5" (1.562 m)  Wt 172 lb 12.8 oz (78.382 kg)  BMI 32.13 kg/m2  LMP 02/28/2001 Height: 5' 1.5" (156.2 cm)  Ht Readings from Last 3 Encounters:  02/26/14 5' 1.5" (1.562 m)  12/18/12 5\' 1"  (1.549 m)    General appearance: alert, cooperative and appears stated age Head: Normocephalic, without obvious abnormality, atraumatic Neck: no adenopathy, supple, symmetrical, trachea midline and thyroid normal to inspection and palpation Lungs: clear to auscultation bilaterally Breasts: normal appearance, no masses or tenderness, No nipple retraction or dimpling, No nipple discharge or bleeding, No axillary or supraclavicular adenopathy Heart: regular rate and rhythm Abdomen: soft, non-tender; no masses,  no organomegaly Extremities: extremities normal, atraumatic, no cyanosis or edema Skin: Skin color, texture, turgor normal. No rashes or lesions Lymph nodes: Cervical, supraclavicular, and axillary nodes normal. No abnormal inguinal nodes palpated Neurologic: Grossly normal   Pelvic: External genitalia:  no lesions              Urethra:  normal appearing urethra with no masses, tenderness or lesions  Bartholin's and Skene's: normal                 Vagina: atrophic appearing vagina with palel color and scant discharge, no lesions, Introitus normal opening              Cervix: normal, non tender, no lesions              Pap taken: Yes.   Bimanual Exam:  Uterus:  normal size, contour, position, consistency, mobility, non-tender              Adnexa: normal adnexa and no mass, fullness, tenderness               Rectovaginal: Confirms               Anus:  normal sphincter tone, no lesions  A:  Well Woman with normal exam  Menopausal no HRT due to DVT  Atrophic vaginitis  Hypothyroid management with PCP,  stable  P:   Reviewed health and wellness pertinent to exam  Discussed importance of advising if vaginal bleeding  Discussed vaginal findings of atrophic vaginitis and etiology. Questions addressed. Discussed OTC use of coconut oil use daily to help with increasing moisture in vagina area. Patient to try to see if this will change and will advise. Patient shown area to apply and detailed instructions given, to not attempt sexually activity until one month of use. When feels comfortable will need to also use coconut oil for sexual activity and alternate position for her comfort level. Voiced understanding.Continue follow up as indicated.  Pap smear taken today with HPV reflex    counseled on breast self exam, mammography screening, adequate intake of calcium and vitamin D, diet and exercise, Kegel's exercises  return annually or prn  An After Visit Summary was printed and given to the patient.

## 2014-02-26 NOTE — Patient Instructions (Signed)

## 2014-02-26 NOTE — Progress Notes (Signed)
Reviewed personally.  M. Suzanne Kojo Liby, MD.  

## 2014-02-27 LAB — HEMOGLOBIN, FINGERSTICK: HEMOGLOBIN, FINGERSTICK: 13.9 g/dL (ref 12.0–16.0)

## 2014-02-27 LAB — IPS PAP TEST WITH REFLEX TO HPV

## 2014-07-31 ENCOUNTER — Other Ambulatory Visit: Payer: Self-pay

## 2014-07-31 DIAGNOSIS — Z1231 Encounter for screening mammogram for malignant neoplasm of breast: Secondary | ICD-10-CM

## 2014-08-08 ENCOUNTER — Ambulatory Visit
Admission: RE | Admit: 2014-08-08 | Discharge: 2014-08-08 | Disposition: A | Discharge status: Home / Self Care | Payer: 59 | Source: Ambulatory Visit

## 2014-08-08 DIAGNOSIS — Z1231 Encounter for screening mammogram for malignant neoplasm of breast: Secondary | ICD-10-CM

## 2014-11-07 ENCOUNTER — Telehealth: Payer: Self-pay | Admitting: Certified Nurse Midwife

## 2014-11-07 NOTE — Telephone Encounter (Signed)
Spoke with patient. Patient states that she has 3-4 "Vaginal warts" that she noticed this morning. States has had two before but "never this many that spread." Denies any pain or discomfort to the area. Patient is concerned as to why these are appearing at this time. Advised she will need to be seen in the office for further evaluation and treatment. Patient is agreeable.Offered appointment for today  But patient declines as she lives 2 hours away. Requesting an appointment for 11/13/2014. Appointment scheduled for 11/13/2014 at 12:45 pm with Melvia Heaps CNM. Patient is agreeable to date and time. Will call for earlier appointment if symptoms worsen.  Routing to provider for final review. Patient agreeable to disposition. Will close encounter.

## 2014-11-07 NOTE — Telephone Encounter (Signed)
Patient says she has a vaginal wart.

## 2014-11-13 ENCOUNTER — Ambulatory Visit (INDEPENDENT_AMBULATORY_CARE_PROVIDER_SITE_OTHER): Payer: 59 | Admitting: Certified Nurse Midwife

## 2014-11-13 ENCOUNTER — Encounter: Payer: Self-pay | Admitting: Certified Nurse Midwife

## 2014-11-13 VITALS — BP 130/60 | HR 92 | Resp 20 | Ht 61.5 in | Wt 178.0 lb

## 2014-11-13 DIAGNOSIS — L723 Sebaceous cyst: Secondary | ICD-10-CM

## 2014-11-13 NOTE — Patient Instructions (Signed)
Epidermal Cyst An epidermal cyst is usually a small, painless lump under the skin. Cysts often occur on the face, neck, stomach, chest, or genitals. The cyst may be filled with a bad smelling paste. Do not pop your cyst. Popping the cyst can cause pain and puffiness (swelling). HOME CARE   Only take medicines as told by your doctor.  Take your medicine (antibiotics) as told. Finish it even if you start to feel better. GET HELP RIGHT AWAY IF:  Your cyst is tender, red, or puffy.  You are not getting better, or you are getting worse.  You have any questions or concerns. MAKE SURE YOU:  Understand these instructions.  Will watch your condition.  Will get help right away if you are not doing well or get worse. Document Released: 03/24/2004 Document Revised: 08/16/2011 Document Reviewed: 08/23/2010 [facility] Patient Information 2015 [place], [place]. This information is not intended to replace advice given to you by your health care provider. Make sure you discuss any questions you have with your health care provider.

## 2014-11-13 NOTE — Progress Notes (Signed)
64 y.o. Married Caucasian female [phone] here complaining of questionable wart growth one external genital area. No vaginal symptoms of itching or burning in vaginal area. No change in discharge. Not currently sexual activity. Has been using coconut oil in past for dryness. [place] a lot. No new personal products. No other health issues today.   O: Healthy WD,WN female Affect: normal Skin:warm and dry Abdomen:soft non tender Pelvic exam:EXTERNAL GENITALIA: normal appearing vulva with no masses, tenderness 3 small sebaceous cyst noted inside right labia only. No other lesions noted. Shown to patient with mirror. No genital warts noted and no history of. VAGINA: no abnormal discharge or lesions and dryness noted at introitus Pelvic exam declined.  A:Sebaceous cyst of right labia Vaginal dryness using coconut oil with good response, so will resume   P: Discussed findings of sebaceous cyst and etiology. No treatment needed. Avoid squeezing area. Advise if tender or redness. Discussed may spontaneously resolve or remain, no problem if remains. Questions addressed. Encouraged to continue moisture for vaginal dryness to avoid problems.   RV prn

## 2014-11-14 NOTE — Progress Notes (Signed)
Reviewed personally.  M. Suzanne Miller, MD.  

## 2015-03-03 ENCOUNTER — Ambulatory Visit: Payer: 59 | Admitting: Certified Nurse Midwife

## 2015-08-14 DIAGNOSIS — E039 Hypothyroidism, unspecified: Secondary | ICD-10-CM | POA: Insufficient documentation

## 2015-08-14 DIAGNOSIS — E785 Hyperlipidemia, unspecified: Secondary | ICD-10-CM | POA: Insufficient documentation

## 2015-08-14 DIAGNOSIS — T783XXA Angioneurotic edema, initial encounter: Secondary | ICD-10-CM | POA: Insufficient documentation

## 2015-08-15 DIAGNOSIS — C439 Malignant melanoma of skin, unspecified: Secondary | ICD-10-CM | POA: Insufficient documentation

## 2015-09-22 ENCOUNTER — Other Ambulatory Visit: Payer: Self-pay | Admitting: Obstetrics & Gynecology

## 2015-09-22 DIAGNOSIS — Z1231 Encounter for screening mammogram for malignant neoplasm of breast: Secondary | ICD-10-CM

## 2015-09-28 ENCOUNTER — Ambulatory Visit: Payer: 59

## 2015-09-28 DIAGNOSIS — K146 Glossodynia: Secondary | ICD-10-CM | POA: Insufficient documentation

## 2015-10-12 ENCOUNTER — Ambulatory Visit
Admission: RE | Admit: 2015-10-12 | Discharge: 2015-10-12 | Disposition: A | Discharge status: Home / Self Care | Payer: 59 | Source: Ambulatory Visit | Attending: Obstetrics & Gynecology | Admitting: Obstetrics & Gynecology

## 2015-10-12 DIAGNOSIS — Z1231 Encounter for screening mammogram for malignant neoplasm of breast: Secondary | ICD-10-CM

## 2016-04-18 ENCOUNTER — Ambulatory Visit (INDEPENDENT_AMBULATORY_CARE_PROVIDER_SITE_OTHER): Payer: 59 | Admitting: Obstetrics & Gynecology

## 2016-04-18 ENCOUNTER — Encounter: Payer: Self-pay | Admitting: Obstetrics & Gynecology

## 2016-04-18 ENCOUNTER — Ambulatory Visit: Payer: 59 | Admitting: Obstetrics & Gynecology

## 2016-04-18 VITALS — BP 136/82 | HR 96 | Resp 14 | Ht 61.25 in | Wt 185.0 lb

## 2016-04-18 DIAGNOSIS — Z124 Encounter for screening for malignant neoplasm of cervix: Secondary | ICD-10-CM

## 2016-04-18 DIAGNOSIS — Z01419 Encounter for gynecological examination (general) (routine) without abnormal findings: Secondary | ICD-10-CM | POA: Diagnosis not present

## 2016-04-18 DIAGNOSIS — Z205 Contact with and (suspected) exposure to viral hepatitis: Secondary | ICD-10-CM | POA: Diagnosis not present

## 2016-04-18 NOTE — Progress Notes (Signed)
66 y.o. G53P3003 Married Caucasian F here for annual exam.  Doing well.  No vaginal bleeding.  Had a retinal tear last year when she tripped over her dog.    Oldest daughter has a 44 year old daughter.  Youngest daughter has an 75 year old.  Son is in [place], [place].  Everyone is doing well.    Patient's last menstrual period was 02/28/2001.          Sexually active: No.  The current method of family planning is post menopausal status.    Exercising: Yes.    gym workouts Smoker:  no  Health Maintenance: Pap:  02/26/14 negative History of abnormal Pap:  yes MMG:  10/12/15 BIRADS 1 negative  Colonoscopy:  2014- polyp.  Done in [place].  Has follow up planned for next year. BMD:   08/25/09 osteopenia  TDaP:  08/29/07  Pneumonia vaccine(s):  Had both in [place]  Zostavax:   ~2 years in [place] C testing: discuss with provider Screening Labs: PCP, Hb today: PCP   reports that she has never smoked. She has never used smokeless tobacco. She reports that she drinks alcohol. She reports that she does not use drugs.  Past Medical History:  Diagnosis Date  . Angioedema   . Anxiety   . DVT (deep venous thrombosis) ([place])    With PE (on hrt)  . Fatty liver 09/14/10  . Hodgkin disease ([place])   . Hypothyroidism   . Pulmonary embolism [facility])     Past Surgical History:  Procedure Laterality Date  . APPENDECTOMY    . CESAREAN SECTION  1981  . HERNIA REPAIR  Age 84  . MELANOMA EXCISION  08/2007  . RETINAL DETACHMENT SURGERY  10/12  . SPLENECTOMY    . TUBAL LIGATION  1985    Current Outpatient Prescriptions  Medication Sig Dispense Refill  . levothyroxine (SYNTHROID, LEVOTHROID) 50 MCG tablet Take 50 mcg by mouth daily before breakfast.    . Multiple Vitamins-Minerals (MULTIVITAMIN PO) Take by mouth.    . Naproxen Sodium (ALEVE PO) Take by mouth daily.     No current facility-administered medications for this visit.     Family History  Problem Relation Age of Onset  . Uterine cancer  Mother   . Breast cancer Paternal Aunt   . Breast cancer Paternal Aunt   . Breast cancer Paternal Aunt   . Autoimmune disease Mother     ROS:  Pertinent items are noted in HPI.  Otherwise, a comprehensive ROS was negative.  Exam:   BP 136/82 (BP Location: Right Arm, Patient Position: Sitting, Cuff Size: Large)   Pulse 96   Resp 14   Ht 5' 1.25" (1.556 m)   Wt 185 lb (83.9 kg)   LMP 02/28/2001   BMI 34.67 kg/m   Weight change: +14#   Height: 5' 1.25" (155.6 cm)  Ht Readings from Last 3 Encounters:  04/18/16 5' 1.25" (1.556 m)  11/13/14 5' 1.5" (1.562 m)  02/26/14 5' 1.5" (1.562 m)    General appearance: alert, cooperative and appears stated age Head: Normocephalic, without obvious abnormality, atraumatic Neck: no adenopathy, supple, symmetrical, trachea midline and thyroid normal to inspection and palpation Lungs: clear to auscultation bilaterally Breasts: normal appearance, no masses or tenderness Heart: regular rate and rhythm Abdomen: soft, non-tender; bowel sounds normal; no masses,  no organomegaly Extremities: extremities normal, atraumatic, no cyanosis or edema Skin: Skin color, texture, turgor normal. No rashes or lesions Lymph nodes: Cervical, supraclavicular, and  axillary nodes normal. No abnormal inguinal nodes palpated Neurologic: Grossly normal  Pelvic: External genitalia:  no lesions              Urethra:  normal appearing urethra with no masses, tenderness or lesions              Bartholins and Skenes: normal                 Vagina: normal appearing vagina with normal color and discharge, no lesions              Cervix: no lesions              Pap taken: Yes.   Bimanual Exam:  Uterus:  normal size, contour, position, consistency, mobility, non-tender              Adnexa: normal adnexa and no mass, fullness, tenderness               Rectovaginal: Confirms               Anus:  normal sphincter tone, no lesions  Chaperone was present for exam.  A:  Well  Woman with normal exam PMP, no HRT H/O DVT Hypothyroidism IBS H/O melanoma 7/09 Burning mouth syndrome, lichen planus?  P:   Mammogram guidelines reviewed.  UTD. pap smear obtained today Hep C antibody obtained today Return annually or prn

## 2016-04-19 LAB — HEPATITIS C ANTIBODY: HCV Ab: NEGATIVE

## 2016-04-20 LAB — IPS PAP TEST WITH HPV

## 2016-06-14 ENCOUNTER — Ambulatory Visit: Payer: 59 | Admitting: Obstetrics & Gynecology

## 2016-06-17 ENCOUNTER — Ambulatory Visit: Payer: 59 | Admitting: Obstetrics & Gynecology

## 2016-09-28 ENCOUNTER — Other Ambulatory Visit: Payer: Self-pay | Admitting: Obstetrics & Gynecology

## 2016-09-28 DIAGNOSIS — Z1231 Encounter for screening mammogram for malignant neoplasm of breast: Secondary | ICD-10-CM

## 2016-11-04 ENCOUNTER — Ambulatory Visit: Payer: 59

## 2017-01-13 ENCOUNTER — Ambulatory Visit
Admission: RE | Admit: 2017-01-13 | Discharge: 2017-01-13 | Disposition: A | Discharge status: Home / Self Care | Payer: 59 | Source: Ambulatory Visit | Attending: Obstetrics & Gynecology | Admitting: Obstetrics & Gynecology

## 2017-01-13 DIAGNOSIS — Z1231 Encounter for screening mammogram for malignant neoplasm of breast: Secondary | ICD-10-CM

## 2017-06-07 ENCOUNTER — Telehealth: Payer: Self-pay | Admitting: Obstetrics & Gynecology

## 2017-06-07 DIAGNOSIS — R102 Pelvic and perineal pain: Secondary | ICD-10-CM

## 2017-06-07 NOTE — Telephone Encounter (Signed)
Patient is having cramping near her ovary and would like to come in tomorrow to see Dr.Miller if possible.

## 2017-06-07 NOTE — Telephone Encounter (Signed)
Spoke with patient. Reports constant "cramping at left ovary" for several weeks and increased flatus. 5/10, tylenol prn provides relief.  Had noticed this cramping prior, described as  Intermittent "ping". Patient states she has discussed this with her chiropractor, because she does work out often to include sit ups, chiropractor recommended f/u with GYN.   Denies any other GYN symptoms, N/V, fever/chills, or urinary complaints.   Patient lives in [place] and is traveling to [place] on 4/11 for another appointment, requesting OV with Dr. Sabra Heck.  PUS scheduled for 06/08/17 at 2:30pm with consult to follow at 3pm with Dr. Sabra Heck. Advised patient will review with Dr. Sabra Heck and return call with any additional recommendations. Advised Dr. Sabra Heck is out of the office today, response may not be immediate. Patient verbalizes understanding and is agreeable.   Dr. Sabra Heck -ok to proceed with PUS?

## 2017-06-07 NOTE — Telephone Encounter (Signed)
Yes, this is fine. Thanks!

## 2017-06-07 NOTE — Telephone Encounter (Signed)
Order placed for PUS. 

## 2017-06-08 ENCOUNTER — Ambulatory Visit (INDEPENDENT_AMBULATORY_CARE_PROVIDER_SITE_OTHER): Payer: 59 | Admitting: Obstetrics & Gynecology

## 2017-06-08 ENCOUNTER — Encounter: Payer: Self-pay | Admitting: Obstetrics & Gynecology

## 2017-06-08 ENCOUNTER — Ambulatory Visit (INDEPENDENT_AMBULATORY_CARE_PROVIDER_SITE_OTHER): Payer: 59

## 2017-06-08 VITALS — BP 156/98 | HR 102 | Resp 16 | Ht 61.25 in | Wt 174.0 lb

## 2017-06-08 DIAGNOSIS — R102 Pelvic and perineal pain: Secondary | ICD-10-CM

## 2017-06-08 DIAGNOSIS — R9389 Abnormal findings on diagnostic imaging of other specified body structures: Secondary | ICD-10-CM | POA: Diagnosis not present

## 2017-06-08 NOTE — Progress Notes (Signed)
67 y.o. G32P3003 Married Caucasian female here for pelvic ultrasound due to pelvic pain and concerns for ovarian disease.  Pain has felt "crampy" in nature and has been present for several weeks.  Also, she's noticed increased flatus.  Tylenol has helped.  Aware these can be seen with ovarian cancer.  Is working out more so also aware this could be the cause.  Has not had any bleeding.  .  Patient's last menstrual period was 02/28/2001.  Contraception: PMP  Findings:  UTERUS: 5.9 x 3.0 x 2.9cm EMS: 5.60mm ADNEXA: Left ovary: 1.4 x 0.9 x 0.6cm       Right ovary: 1.0 x 0.7 x 0.7cm CUL DE SAC: no free fluid  Discussion:  Findings reviewed including thickened endometrium.  Although I am doubtful of pathology, I do feel endometrial biopsy is indicated.  Consent obtained.  Endometrial biopsy recommended.  Discussed with patient.  Verbal and written consent obtained.   Procedure:  Speculum placed.  Cervix visualized and cleansed with betadine prep.  A single toothed tenaculum was applied to the anterior lip of the cervix.  Endometrial pipelle was advanced through the cervix into the endometrial cavity without difficulty.  Pipelle passed to 6cm.  Suction applied and pipelle removed with scant tissue present.  Second pass was then performed with improved tissue sample obtained.  Tenculum removed.  No bleeding noted.  Patient tolerated procedure well.  Assessment:  Cramping pelvic pain Thickened endometrium  Plan:  Biopsy pending.  Results will be called to pt and additional recommendations will be made at that time.  ~15 minutes spent with patient >50% of time was in face to face discussion of above.

## 2017-06-08 NOTE — Telephone Encounter (Signed)
Encounter closed

## 2017-06-12 ENCOUNTER — Telehealth: Payer: Self-pay | Admitting: Obstetrics & Gynecology

## 2017-06-12 NOTE — Telephone Encounter (Signed)
Spoke with patient. Advised EMB dated 06/08/17 not resulted, can take 5-7 days. Advised once received and reviewed by Dr. Sabra Heck our office will return call to notify of results. Patient verbalizes understanding,   Routing to provider for final review. Patient is agreeable to disposition. Will close encounter.

## 2017-06-12 NOTE — Telephone Encounter (Signed)
Patient called to check on the status of her lab results. She said she was told they'd be ready for her today.

## 2017-07-13 ENCOUNTER — Telehealth: Payer: Self-pay | Admitting: Obstetrics & Gynecology

## 2017-07-13 NOTE — Telephone Encounter (Signed)
Spoke with patient. Advised AEX still recommended. AEX includes overall GYN health -breast, bone health, menopause, pelvic exam, pap.   Patient request to reschedule AEX to later date. AEX scheduled for 01/08/18 at 9am with Dr. Sabra Heck.  Routing to provider for final review. Patient is agreeable to disposition. Will close encounter.

## 2017-07-13 NOTE — Telephone Encounter (Signed)
Patient has an aex scheduled 07/18/17 with Dr.Miller. Patient is asking if Dr.Miller would still like to see her considering that she was just in 06/08/17 for a PUS?

## 2017-07-18 ENCOUNTER — Ambulatory Visit: Payer: 59 | Admitting: Obstetrics & Gynecology

## 2017-08-30 ENCOUNTER — Telehealth: Payer: Self-pay | Admitting: Obstetrics & Gynecology

## 2017-08-30 NOTE — Telephone Encounter (Signed)
Error pleas disregard

## 2017-08-30 NOTE — Telephone Encounter (Signed)
Patient stated that she received something in the mail from her insurance company stating that they need precertification for [facility] 06/08/17. They also need medical necessity and physicians orders, as well as any other supporting documents. Claim #: [id number] and that information can be faxed to [phone]. Patient stated that she would like any updates to be left as a voicemail on her phone. Cc: [facility] [place]

## 2017-08-30 NOTE — Telephone Encounter (Signed)
Business office call. Information relayed. Encounter closed.

## 2018-01-08 ENCOUNTER — Ambulatory Visit: Payer: Self-pay | Admitting: Obstetrics & Gynecology

## 2018-02-27 NOTE — Progress Notes (Signed)
67 y.o. G44P3003 Married White or Caucasian female here for annual exam.  Doing well.  Daughter with 13 year old granddaughter bought 31 acres and built a house.  They moved to about 45 minutes away.  Cynthia Freeman, granddaughter, is six.    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    Denies vaginal bleeding.    Patient's last menstrual period was 02/28/2001.          Sexually active: Yes.    The current method of family planning is post menopausal status.    Exercising: Yes.    Aerbics 45-60 min day Smoker:  no  Health Maintenance:  Pap: 04/18/16 negative HR HPV neg 02/26/14 negative  History of abnormal Pap:  yes MMG:  01/13/17 Density A / Bi-rads 1 Neg Colonoscopy:  2014 polyp done in [place].  Has been referred from PCP  BMD:   08/25/09 osteopenia TDaP:  08/29/07.  She is sure this is sooner than this.   Pneumonia vaccine(s):  Had both in [place] per patient Shingrix:   Completed in December Hep C testing: 04/18/16 neg Screening Labs: pcp, Hb today: PCP, Urine today: n/a   reports that she has never smoked. She has never used smokeless tobacco. She reports current alcohol use. She reports that she does not use drugs.  Past Medical History:  Diagnosis Date  . Angioedema   . Anxiety   . DVT (deep venous thrombosis) ([place])    With PE (on hrt)  . Fatty liver 09/14/10  . Hodgkin disease ([place])   . Hypothyroidism   . Pulmonary embolism [facility])     Past Surgical History:  Procedure Laterality Date  . APPENDECTOMY    . CESAREAN SECTION  1981  . HERNIA REPAIR  Age 54  . MELANOMA EXCISION  08/2007  . RETINAL DETACHMENT SURGERY  10/12  . SPLENECTOMY    . TUBAL LIGATION  1985    Current Outpatient Medications  Medication Sig Dispense Refill  . aspirin 325 MG tablet Take by mouth daily as needed.    . cevimeline (EVOXAC) 30 MG capsule Take 1 capsule by mouth 3 (three) times daily as needed.  5  . famotidine (PEPCID) 20 MG tablet Take 20 mg by mouth 2  (two) times daily.    Marland Kitchen levothyroxine (SYNTHROID, LEVOTHROID) 75 MCG tablet Take 1 tablet by mouth daily.  0  . Multiple Vitamins-Minerals (MULTIVITAMIN PO) Take by mouth.    . Naproxen Sodium (ALEVE PO) Take by mouth daily.    . predniSONE (DELTASONE) 10 MG tablet Take 1 tablet by mouth as directed.    . traMADol (ULTRAM) 50 MG tablet Take 50 mg by mouth every 6 (six) hours as needed. for pain  0   No current facility-administered medications for this visit.     Family History  Problem Relation Age of Onset  . Uterine cancer Mother   . Autoimmune disease Mother   . Breast cancer Paternal Aunt   . Breast cancer Paternal Aunt   . Breast cancer Paternal Aunt     Review of Systems  Cardiovascular:       Heart mummer  All other systems reviewed and are negative.   Exam:   BP 138/74   Pulse 74   Resp 14   Ht 5' 0.5" (1.537 m)   Wt 171 lb (77.6 kg)   LMP 02/28/2001   BMI 32.85 kg/m    Height: 5' 0.5" (  153.7 cm)  Ht Readings from Last 3 Encounters:  03/02/18 5' 0.5" (1.537 m)  06/08/17 5' 1.25" (1.556 m)  04/18/16 5' 1.25" (1.556 m)    General appearance: alert, cooperative and appears stated age Head: Normocephalic, without obvious abnormality, atraumatic Neck: no adenopathy, supple, symmetrical, trachea midline and thyroid normal to inspection and palpation Lungs: clear to auscultation bilaterally Breasts: normal appearance, no masses or tenderness Heart: regular rate and rhythm Abdomen: soft, non-tender; bowel sounds normal; no masses,  no organomegaly Extremities: extremities normal, atraumatic, no cyanosis or edema Skin: Skin color, texture, turgor normal. No rashes or lesions Lymph nodes: Cervical, supraclavicular, and axillary nodes normal. No abnormal inguinal nodes palpated Neurologic: Grossly normal   Pelvic: External genitalia:  no lesions              Urethra:  normal appearing urethra with no masses, tenderness or lesions              Bartholins and  Skenes: normal                 Vagina: normal appearing vagina with normal color and discharge, no lesions              Cervix: no lesions              Pap taken: No. Bimanual Exam:  Uterus:  normal size, contour, position, consistency, mobility, non-tender              Adnexa: normal adnexa and no mass, fullness, tenderness               Rectovaginal: Confirms               Anus:  normal sphincter tone, no lesions  Chaperone was present for exam.  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  Doing 3D MMG.  [facility] with neg HR HPV 2018 Lab work done with PCP this year. Will do BMD with MMG She is sure her Tdap is UTD.   Referral to cardiology due to louder murmur noted today return annually or prn

## 2018-03-02 ENCOUNTER — Encounter: Payer: Self-pay | Admitting: Obstetrics & Gynecology

## 2018-03-02 ENCOUNTER — Ambulatory Visit (INDEPENDENT_AMBULATORY_CARE_PROVIDER_SITE_OTHER): Payer: 59 | Admitting: Obstetrics & Gynecology

## 2018-03-02 VITALS — BP 138/74 | HR 74 | Resp 14 | Ht 60.5 in | Wt 171.0 lb

## 2018-03-02 DIAGNOSIS — R011 Cardiac murmur, unspecified: Secondary | ICD-10-CM

## 2018-03-02 DIAGNOSIS — Z01419 Encounter for gynecological examination (general) (routine) without abnormal findings: Secondary | ICD-10-CM | POA: Diagnosis not present

## 2018-03-02 DIAGNOSIS — E2839 Other primary ovarian failure: Secondary | ICD-10-CM

## 2018-03-02 NOTE — Patient Instructions (Signed)
Please schedule the bone density with your mammogram.

## 2018-03-27 ENCOUNTER — Other Ambulatory Visit: Payer: Self-pay | Admitting: Obstetrics & Gynecology

## 2018-03-27 DIAGNOSIS — Z1231 Encounter for screening mammogram for malignant neoplasm of breast: Secondary | ICD-10-CM

## 2018-04-12 ENCOUNTER — Encounter: Payer: Self-pay | Admitting: Cardiology

## 2018-04-30 ENCOUNTER — Ambulatory Visit: Payer: 59 | Admitting: Cardiology

## 2018-05-09 ENCOUNTER — Ambulatory Visit
Admission: RE | Admit: 2018-05-09 | Discharge: 2018-05-09 | Disposition: A | Discharge status: Home / Self Care | Payer: 59 | Source: Ambulatory Visit | Attending: Obstetrics & Gynecology | Admitting: Obstetrics & Gynecology

## 2018-05-09 ENCOUNTER — Other Ambulatory Visit: Payer: Self-pay

## 2018-05-09 DIAGNOSIS — Z1231 Encounter for screening mammogram for malignant neoplasm of breast: Secondary | ICD-10-CM

## 2018-08-22 ENCOUNTER — Ambulatory Visit: Payer: 59 | Admitting: Cardiology

## 2019-06-20 ENCOUNTER — Other Ambulatory Visit: Payer: Self-pay | Admitting: Obstetrics & Gynecology

## 2019-06-20 ENCOUNTER — Telehealth: Payer: Self-pay | Admitting: Obstetrics & Gynecology

## 2019-06-20 DIAGNOSIS — Z1231 Encounter for screening mammogram for malignant neoplasm of breast: Secondary | ICD-10-CM

## 2019-06-20 DIAGNOSIS — M8580 Other specified disorders of bone density and structure, unspecified site: Secondary | ICD-10-CM

## 2019-06-20 NOTE — Telephone Encounter (Signed)
BMD order signed.  [place] for pt to schedule.  Encounter close.  Thanks.

## 2019-06-20 NOTE — Telephone Encounter (Signed)
Patient request an order be sent to [facility] for her BMD.

## 2019-06-20 NOTE — Telephone Encounter (Signed)
Last AEX 03/02/18, Next AEX 07/05/19 with Dr Sabra Heck  Last MMG 05/09/18 Birads 1 Neg Next MMG scheduled 06/28/2019 at [facility]  Last BMD 07/2009 with osteopenia    Spoke with pt. Pt needing BMD order to have done this year. Pt said did not have done last year with MMG due to limited time in [place] Pt lives in [place].   Last BMD order expired 05/2018.  Pt states taking Vit D3 2000 IU daily since pandemic started, does not take Calcium.  Pt states exercises 2-3/week.  Will review with Dr Sabra Heck for BMD order . Left detailed message with pt to schedule BMD at earliest convenience.    Future orders placed at [facility] for BMD.   Routing to Dr Sabra Heck for review.  Encounter closed.

## 2019-06-28 ENCOUNTER — Other Ambulatory Visit: Payer: Self-pay

## 2019-06-28 ENCOUNTER — Ambulatory Visit
Admission: RE | Admit: 2019-06-28 | Discharge: 2019-06-28 | Disposition: A | Discharge status: Home / Self Care | Payer: 59 | Source: Ambulatory Visit | Attending: Obstetrics & Gynecology | Admitting: Obstetrics & Gynecology

## 2019-06-28 DIAGNOSIS — Z1231 Encounter for screening mammogram for malignant neoplasm of breast: Secondary | ICD-10-CM

## 2019-07-05 ENCOUNTER — Ambulatory Visit: Payer: 59 | Admitting: Obstetrics & Gynecology

## 2019-10-09 ENCOUNTER — Other Ambulatory Visit: Payer: 59

## 2019-11-05 NOTE — Progress Notes (Deleted)
69 y.o. G5P3003 Married White or Caucasian female here for annual exam.    Patient's last menstrual period was 02/28/2001.          Sexually active: {yes no:314532}  The current method of family planning is post menopausal status.    Exercising: {yes no:314532}  {types:19826} Smoker:  {YES NO:22349}  Health Maintenance: Pap:  04-18-16 neg HPV HR neg History of abnormal Pap:  yes MMG:  06-28-2019 category b density birads 1:neg Colonoscopy:  2014 polyp done in [facility] BMD:   2011 osteopenia TDaP:  2009 Pneumonia vaccine(s):  done Shingrix:   done Hep C testing: 2018 Screening Labs: ***   reports that she has never smoked. She has never used smokeless tobacco. She reports current alcohol use. She reports that she does not use drugs.  Past Medical History:  Diagnosis Date  . Angioedema   . Anxiety   . DVT (deep venous thrombosis) ([place])    With PE (on hrt)  . Fatty liver 09/14/10  . Hodgkin disease ([place])   . Hypothyroidism   . Pulmonary embolism [facility])     Past Surgical History:  Procedure Laterality Date  . APPENDECTOMY    . CESAREAN SECTION  1981  . HERNIA REPAIR  Age 69  . MELANOMA EXCISION  08/2007  . RETINAL DETACHMENT SURGERY  10/12  . SPLENECTOMY    . TUBAL LIGATION  1985    Current Outpatient Medications  Medication Sig Dispense Refill  . aspirin 325 MG tablet Take by mouth daily as needed.    . cevimeline (EVOXAC) 30 MG capsule Take 1 capsule by mouth 3 (three) times daily as needed.  5  . famotidine (PEPCID) 20 MG tablet Take 20 mg by mouth 2 (two) times daily.    Marland Kitchen levothyroxine (SYNTHROID, LEVOTHROID) 75 MCG tablet Take 1 tablet by mouth daily.  0  . Multiple Vitamins-Minerals (MULTIVITAMIN PO) Take by mouth.    . Naproxen Sodium (ALEVE PO) Take by mouth daily.    . predniSONE (DELTASONE) 10 MG tablet Take 1 tablet by mouth as directed.    . traMADol (ULTRAM) 50 MG tablet Take 50 mg by mouth every 6 (six) hours as needed. for pain  0   No current  facility-administered medications for this visit.    Family History  Problem Relation Age of Onset  . Uterine cancer Mother   . Autoimmune disease Mother   . Breast cancer Paternal Aunt   . Breast cancer Paternal Aunt   . Breast cancer Paternal Aunt     Review of Systems  Exam:   LMP 02/28/2001      General appearance: alert, cooperative and appears stated age Head: Normocephalic, without obvious abnormality, atraumatic Neck: no adenopathy, supple, symmetrical, trachea midline and thyroid {EXAM; THYROID:18604} Lungs: clear to auscultation bilaterally Breasts: {Exam; breast:13139::"normal appearance, no masses or tenderness"} Heart: regular rate and rhythm Abdomen: soft, non-tender; bowel sounds normal; no masses,  no organomegaly Extremities: extremities normal, atraumatic, no cyanosis or edema Skin: Skin color, texture, turgor normal. No rashes or lesions Lymph nodes: Cervical, supraclavicular, and axillary nodes normal. No abnormal inguinal nodes palpated Neurologic: Grossly normal   Pelvic: External genitalia:  no lesions              Urethra:  normal appearing urethra with no masses, tenderness or lesions              Bartholins and Skenes: normal  Vagina: normal appearing vagina with normal color and discharge, no lesions              Cervix: {exam; cervix:14595}              Pap taken: {yes no:314532} Bimanual Exam:  Uterus:  {exam; uterus:12215}              Adnexa: {exam; adnexa:12223}               Rectovaginal: Confirms               Anus:  normal sphincter tone, no lesions  Chaperone, ***Terence Lux, CMA, was present for exam.  A:  Well Woman with normal exam  P:   {plan; gyn:5269::"mammogram","pap smear","return annually or prn"}

## 2019-11-07 ENCOUNTER — Ambulatory Visit: Payer: 59 | Admitting: Obstetrics & Gynecology

## 2019-12-17 NOTE — Progress Notes (Signed)
Patient decided leave without being seen today due to [place]'s mandated mask policy.

## 2019-12-20 ENCOUNTER — Other Ambulatory Visit: Payer: Self-pay

## 2019-12-20 ENCOUNTER — Encounter: Payer: 59 | Admitting: Obstetrics & Gynecology

## 2019-12-20 VITALS — Wt 163.0 lb

## 2019-12-20 DIAGNOSIS — Z01419 Encounter for gynecological examination (general) (routine) without abnormal findings: Secondary | ICD-10-CM

## 2019-12-30 ENCOUNTER — Ambulatory Visit: Payer: 59 | Admitting: Obstetrics & Gynecology

## 2020-01-29 DIAGNOSIS — I2699 Other pulmonary embolism without acute cor pulmonale: Secondary | ICD-10-CM

## 2020-01-29 HISTORY — DX: Other pulmonary embolism without acute cor pulmonale: I26.99

## 2020-02-08 DIAGNOSIS — I2694 Multiple subsegmental pulmonary emboli without acute cor pulmonale: Secondary | ICD-10-CM | POA: Insufficient documentation

## 2020-02-08 DIAGNOSIS — J1282 Pneumonia due to coronavirus disease 2019: Secondary | ICD-10-CM | POA: Insufficient documentation

## 2020-07-15 ENCOUNTER — Other Ambulatory Visit: Payer: Self-pay | Admitting: Obstetrics & Gynecology

## 2020-07-15 DIAGNOSIS — M8580 Other specified disorders of bone density and structure, unspecified site: Secondary | ICD-10-CM

## 2020-07-15 DIAGNOSIS — Z1231 Encounter for screening mammogram for malignant neoplasm of breast: Secondary | ICD-10-CM

## 2020-12-25 ENCOUNTER — Ambulatory Visit: Payer: 59

## 2020-12-25 ENCOUNTER — Other Ambulatory Visit: Payer: 59

## 2021-01-07 ENCOUNTER — Ambulatory Visit (HOSPITAL_BASED_OUTPATIENT_CLINIC_OR_DEPARTMENT_OTHER): Payer: 59 | Admitting: Obstetrics & Gynecology

## 2021-01-15 ENCOUNTER — Ambulatory Visit: Payer: 59

## 2021-02-16 ENCOUNTER — Ambulatory Visit: Payer: 59

## 2021-03-25 ENCOUNTER — Other Ambulatory Visit (HOSPITAL_COMMUNITY)
Admission: RE | Admit: 2021-03-25 | Discharge: 2021-03-25 | Disposition: A | Discharge status: Home / Self Care | Payer: 59 | Source: Ambulatory Visit | Attending: Obstetrics & Gynecology | Admitting: Obstetrics & Gynecology

## 2021-03-25 ENCOUNTER — Ambulatory Visit (INDEPENDENT_AMBULATORY_CARE_PROVIDER_SITE_OTHER): Payer: 59 | Admitting: Obstetrics & Gynecology

## 2021-03-25 ENCOUNTER — Other Ambulatory Visit: Payer: Self-pay

## 2021-03-25 ENCOUNTER — Ambulatory Visit
Admission: RE | Admit: 2021-03-25 | Discharge: 2021-03-25 | Disposition: A | Discharge status: Home / Self Care | Payer: 59 | Source: Ambulatory Visit | Attending: Obstetrics & Gynecology | Admitting: Obstetrics & Gynecology

## 2021-03-25 ENCOUNTER — Encounter (HOSPITAL_BASED_OUTPATIENT_CLINIC_OR_DEPARTMENT_OTHER): Payer: Self-pay | Admitting: Obstetrics & Gynecology

## 2021-03-25 VITALS — BP 130/60 | HR 90 | Ht 60.0 in | Wt 154.4 lb

## 2021-03-25 DIAGNOSIS — R002 Palpitations: Secondary | ICD-10-CM

## 2021-03-25 DIAGNOSIS — F418 Other specified anxiety disorders: Secondary | ICD-10-CM

## 2021-03-25 DIAGNOSIS — N941 Unspecified dyspareunia: Secondary | ICD-10-CM | POA: Diagnosis not present

## 2021-03-25 DIAGNOSIS — Z01419 Encounter for gynecological examination (general) (routine) without abnormal findings: Secondary | ICD-10-CM | POA: Diagnosis not present

## 2021-03-25 DIAGNOSIS — Z1231 Encounter for screening mammogram for malignant neoplasm of breast: Secondary | ICD-10-CM

## 2021-03-25 DIAGNOSIS — R4589 Other symptoms and signs involving emotional state: Secondary | ICD-10-CM

## 2021-03-25 DIAGNOSIS — Z124 Encounter for screening for malignant neoplasm of cervix: Secondary | ICD-10-CM

## 2021-03-25 DIAGNOSIS — Z86711 Personal history of pulmonary embolism: Secondary | ICD-10-CM | POA: Diagnosis not present

## 2021-03-25 MED ORDER — ASPIRIN EC 81 MG PO TBEC: 81.0000 mg | DELAYED_RELEASE_TABLET | Freq: Every day | ORAL | 11 refills | Status: AC

## 2021-03-25 NOTE — Progress Notes (Signed)
71 y.o. G48P3003 Married White or Caucasian female here for breast and pelvic exam.  I am also following her for postmenopausal status/vaginal dryness concerns.  Pt had more complicated year.  H/O Covid in 01/2020 with multiple PEs.  Since that time, she's had a lot of issues with palpitations that occur almost every time she eats.  This will occur for two to three hours.  Has been eating smaller and smaller portions.  Has lost almost 20 pounds due to food restrictions.  She has seen cardiology and had a stress test.  Also, she worse a monitor and had EKG.  She is on a beta blocker.  At one point, this dosage was up to 60mg .  Has sought opinion of second cardiologist who has lowered dosage.  He also does not feel these symptoms are indicating CVD.  She has follow up tomorrow.      What records I could review in Care Everywhere, I did: cardiac echo, stress test  She's also seen gastroenterology as having a lot more reflux issues.  There is a concern she may have a hiatal hernia causing symptoms.    Endoscopy and colonoscopy was scheduled for Monday.  Her husband has funeral he has to attend on Monday so this has been rescheduled.  This has been rescheduled to early March.  She had PEs after having Covid in 01/2020.  She was on this for 5 months.  During this saw pulmonology, Dr. Jeannine Kitten, and she was transitioned to Xarelto.  Last time she saw pulmonology was about six months ago.  Pt does have hx of PE when on HRT in the past.  Not on ASA at this time.  She is clearly anxious about having a correct diagnosis regarding her cardiac issues.  We did discuss possible SSRI use to help with anxiety.  I don't think this will eliminate the palpitations but could help decrease her worry.  She declines for now.  She does have some questions about possible OTC treatment for vaginal atrophy/dysparuenia.  We discussed Revaree today and this is safe for her to use as does not contain any hormonal therapy.  Denies  vaginal bleeding.  Patient's last menstrual period was 02/28/2001.          Sexually active: Yes.   Continues to have a lot of vaginal dryness H/O STD:  no  Health Maintenance: PCP:  Dr. Marchelle Gearing.  Last wellness appt was 10/2020.  Did blood work at that appt:  yes Vaccines are up to date:  pt aware I do not have date for pneumonia and shingles vaccination Colonoscopy:  2014 MMG:  03/25/2021 BMD:  08/25/2009 Last pap smear:  04/18/2016 Negative.   H/o abnormal pap smear:      reports that she has never smoked. She has never used smokeless tobacco. She reports current alcohol use. She reports that she does not use drugs.  Past Medical History:  Diagnosis Date   Anxiety    DVT (deep venous thrombosis) ([facility])    With PE (on hrt)   Fatty liver 09/14/2010   Hodgkin disease ([place])    Hypothyroidism    Idiopathic angio-edema-urticaria    Pulmonary embolism ([place]) 01/2020   with Covid    Past Surgical History:  Procedure Laterality Date   APPENDECTOMY     [place]  Age 78   MELANOMA EXCISION  08/2007   RETINAL DETACHMENT SURGERY  10/12   SPLENECTOMY     TUBAL  LIGATION  1985    Current Outpatient Medications  Medication Sig Dispense Refill   aspirin EC 81 MG tablet Take 1 tablet (81 mg total) by mouth daily. Swallow whole. 30 tablet 11   cevimeline (EVOXAC) 30 MG capsule Take 1 capsule by mouth 3 (three) times daily as needed.  5   famotidine (PEPCID) 20 MG tablet Take 20 mg by mouth 2 (two) times daily.     levothyroxine (SYNTHROID, LEVOTHROID) 75 MCG tablet Take 1 tablet by mouth daily.  0   Multiple Vitamins-Minerals (MULTIVITAMIN PO) Take by mouth.     Naproxen Sodium (ALEVE PO) Take by mouth daily.     predniSONE (DELTASONE) 10 MG tablet Take 1 tablet by mouth as directed.     traMADol (ULTRAM) 50 MG tablet Take 50 mg by mouth every 6 (six) hours as needed. for pain  0   No current facility-administered medications for this visit.     Family History  Problem Relation Age of Onset   Uterine cancer Mother    Autoimmune disease Mother    Breast cancer Paternal Aunt    Breast cancer Paternal Aunt    Breast cancer Paternal Aunt     Review of Systems  Constitutional: Negative.   Cardiovascular:  Positive for palpitations.  Gastrointestinal: Negative.   Genitourinary: Negative.   Psychiatric/Behavioral:         Anxiety over continues palpitations   Exam:   BP 130/60 (BP Location: Right Arm, Patient Position: Sitting, Cuff Size: Normal)    Pulse 90    Ht 5' (1.524 m)    Wt 154 lb 6.4 oz (70 kg)    LMP 02/28/2001    BMI 30.15 kg/m   Height: 5' (152.4 cm)  General appearance: alert, cooperative and appears stated age CV:  reg rhythm, Grade 3 systolic murmur Breasts: normal appearance, no masses or tenderness Abdomen: soft, non-tender; bowel sounds normal; no masses,  no organomegaly Lymph nodes: Cervical, supraclavicular, and axillary nodes normal.  No abnormal inguinal nodes palpated Neurologic: Grossly normal  Pelvic: External genitalia:  no lesions              Urethra:  normal appearing urethra with no masses, tenderness or lesions              Bartholins and Skenes: normal                 Vagina: normal appearing vagina with atrophic changes and no discharge, no lesions              Cervix: no lesions              Pap taken: Yes.   Bimanual Exam:  Uterus:  normal              Adnexa: no mass, fullness, tenderness               Rectovaginal: Confirms               Anus:  normal sphincter tone, no lesions  Chaperone, Octaviano Batty, CMA, was present for exam.  Assessment/Plan: 1. Encntr for gyn exam (general) (routine) w/o abn findings - pap smear obtained today - MMG just done today.  Results are not finalized. - colonoscopy 2014.  Is scheduled in march - BMD scheduled 05/21/2021 - lab work done with PCP in the fall.  Pt is going to make sure thyroid was tested - pt aware I do not have some vaccination  dates for  her and she is going to update these for me  2. Cervical cancer screening - Cytology - PAP( [place]) - PR OBTAINING SCREEN PAP SMEAR  3. Dyspareunia, female - will try Revaree.  Information provided.  4. History of pulmonary embolism - pt aware I am concerned she is not taking an aspirin.  Will reach out to hematology for input.  5. Palpitations - pt has follow up with cardiology tomorrow. - have encouraged her to call GI and see if barium swallow can be scheduled while waiting for colonoscopy/endoscopy  6. Anxiety about health - discussed possible SSRI use but declined for now   Lengthy visit with pt.  65 minutes total. The majority of time was spent reviewing/updating chart with history from the last year, discussing concerns and possible solutions/evaluation with specialists.

## 2021-03-26 DIAGNOSIS — Z86711 Personal history of pulmonary embolism: Secondary | ICD-10-CM | POA: Insufficient documentation

## 2021-03-26 DIAGNOSIS — R002 Palpitations: Secondary | ICD-10-CM | POA: Insufficient documentation

## 2021-03-26 DIAGNOSIS — N941 Unspecified dyspareunia: Secondary | ICD-10-CM | POA: Insufficient documentation

## 2021-03-29 ENCOUNTER — Encounter (HOSPITAL_BASED_OUTPATIENT_CLINIC_OR_DEPARTMENT_OTHER): Payer: Self-pay | Admitting: Obstetrics & Gynecology

## 2021-03-29 LAB — CYTOLOGY - PAP: Diagnosis: NEGATIVE

## 2021-03-31 ENCOUNTER — Telehealth (HOSPITAL_BASED_OUTPATIENT_CLINIC_OR_DEPARTMENT_OTHER): Payer: Self-pay | Admitting: *Deleted

## 2021-03-31 NOTE — Telephone Encounter (Signed)
Called pt to inform her that Dr. Sabra Heck had reached out to Dr. Marin Olp regarding whether she needs to be on anti-coagulation therapy. Advised pt that both Dr. Marin Olp and Dr. Sabra Heck feel that the pt should be on anti-coagulation therapy with her history of DVTs and PEs, once with covid and once on HRTs. Pt states that she will reach out to her current provider regarding this, giving them one last chance to listen and advise her. She states that she has been having palpitations and they are not addressing the issue. She will call us back if she wants the referral placed to Dr. Marin Olp.

## 2021-05-08 ENCOUNTER — Encounter (HOSPITAL_BASED_OUTPATIENT_CLINIC_OR_DEPARTMENT_OTHER): Payer: Self-pay | Admitting: Obstetrics & Gynecology

## 2021-05-09 ENCOUNTER — Other Ambulatory Visit (HOSPITAL_BASED_OUTPATIENT_CLINIC_OR_DEPARTMENT_OTHER): Payer: Self-pay | Admitting: Obstetrics & Gynecology

## 2021-05-09 MED ORDER — SULFAMETHOXAZOLE-TRIMETHOPRIM 800-160 MG PO TABS: 1.0000 | ORAL_TABLET | Freq: Two times a day (BID) | ORAL | 0 refills | Status: DC

## 2021-05-21 ENCOUNTER — Other Ambulatory Visit: Payer: 59

## 2022-04-15 ENCOUNTER — Other Ambulatory Visit: Payer: Self-pay | Admitting: Obstetrics & Gynecology

## 2022-04-15 DIAGNOSIS — Z1231 Encounter for screening mammogram for malignant neoplasm of breast: Secondary | ICD-10-CM

## 2022-06-22 ENCOUNTER — Ambulatory Visit
Admission: RE | Admit: 2022-06-22 | Discharge: 2022-06-22 | Disposition: A | Discharge status: Home / Self Care | Payer: PRIVATE HEALTH INSURANCE | Source: Ambulatory Visit | Attending: Obstetrics & Gynecology | Admitting: Obstetrics & Gynecology

## 2022-06-22 DIAGNOSIS — Z1231 Encounter for screening mammogram for malignant neoplasm of breast: Secondary | ICD-10-CM

## 2022-09-29 HISTORY — PX: AORTIC VALVE REPLACEMENT: SHX41

## 2023-06-28 ENCOUNTER — Other Ambulatory Visit: Payer: Self-pay | Admitting: Obstetrics & Gynecology

## 2023-06-28 DIAGNOSIS — Z1231 Encounter for screening mammogram for malignant neoplasm of breast: Secondary | ICD-10-CM

## 2023-07-21 ENCOUNTER — Ambulatory Visit: Payer: PRIVATE HEALTH INSURANCE

## 2023-07-21 ENCOUNTER — Ambulatory Visit (HOSPITAL_BASED_OUTPATIENT_CLINIC_OR_DEPARTMENT_OTHER): Payer: PRIVATE HEALTH INSURANCE | Admitting: Obstetrics & Gynecology

## 2023-10-24 ENCOUNTER — Ambulatory Visit: Payer: PRIVATE HEALTH INSURANCE

## 2023-10-24 ENCOUNTER — Ambulatory Visit (HOSPITAL_BASED_OUTPATIENT_CLINIC_OR_DEPARTMENT_OTHER): Payer: PRIVATE HEALTH INSURANCE | Admitting: Obstetrics & Gynecology

## 2023-11-16 ENCOUNTER — Ambulatory Visit: Payer: PRIVATE HEALTH INSURANCE

## 2023-11-16 ENCOUNTER — Ambulatory Visit (HOSPITAL_BASED_OUTPATIENT_CLINIC_OR_DEPARTMENT_OTHER): Payer: PRIVATE HEALTH INSURANCE | Admitting: Obstetrics & Gynecology

## 2023-11-30 ENCOUNTER — Other Ambulatory Visit (HOSPITAL_COMMUNITY)
Admission: RE | Admit: 2023-11-30 | Discharge: 2023-11-30 | Disposition: A | Discharge status: Home / Self Care | Source: Ambulatory Visit | Attending: Obstetrics & Gynecology | Admitting: Obstetrics & Gynecology

## 2023-11-30 ENCOUNTER — Ambulatory Visit
Admission: RE | Admit: 2023-11-30 | Discharge: 2023-11-30 | Disposition: A | Discharge status: Home / Self Care | Payer: PRIVATE HEALTH INSURANCE | Source: Ambulatory Visit | Attending: Obstetrics & Gynecology | Admitting: Obstetrics & Gynecology

## 2023-11-30 ENCOUNTER — Encounter (HOSPITAL_BASED_OUTPATIENT_CLINIC_OR_DEPARTMENT_OTHER): Payer: Self-pay | Admitting: Obstetrics & Gynecology

## 2023-11-30 ENCOUNTER — Ambulatory Visit (INDEPENDENT_AMBULATORY_CARE_PROVIDER_SITE_OTHER): Payer: PRIVATE HEALTH INSURANCE | Admitting: Obstetrics & Gynecology

## 2023-11-30 VITALS — BP 129/76 | HR 95 | Ht 60.0 in | Wt 150.8 lb

## 2023-11-30 DIAGNOSIS — Z01419 Encounter for gynecological examination (general) (routine) without abnormal findings: Secondary | ICD-10-CM

## 2023-11-30 DIAGNOSIS — Z1231 Encounter for screening mammogram for malignant neoplasm of breast: Secondary | ICD-10-CM

## 2023-11-30 DIAGNOSIS — Z86711 Personal history of pulmonary embolism: Secondary | ICD-10-CM | POA: Diagnosis not present

## 2023-11-30 DIAGNOSIS — Z86718 Personal history of other venous thrombosis and embolism: Secondary | ICD-10-CM | POA: Diagnosis not present

## 2023-11-30 DIAGNOSIS — Z124 Encounter for screening for malignant neoplasm of cervix: Secondary | ICD-10-CM

## 2023-11-30 NOTE — Progress Notes (Signed)
 Breast and Pelvic Exam Patient name: Cynthia Freeman MRN [id number]  Date of birth: February 24, 1951 Chief Complaint:   No complaints  History of Present Illness:   Cynthia Freeman is a 73 y.o. G52P3003 Caucasian female being seen today for breast and pelvic exam.  Had aortic valve placement done last August.  During a TEE, she woke up and now has laryngeal issues with raspy voice.  She has seen Dr. Valery, ENT.  Has a CT scan scheduled and some options have been discussed with pt.    Children are doing well.  Daughter has significant issues with endometriosis.  Had renal tumor last year and had to have a 1/4 of one removed due to this tumor.  Pathology was a renal cell carcinoma.  She didn't need any additional therapy.  Denies vaginal bleeding.    Patient's last menstrual period was 02/28/2001.  Last pap 03/25/2021. Results were: NILM w/ HRHPV not done. H/O abnormal pap: yes in late 41s and early 30s Last mammogram: 06/22/2022. Results were: normal. Patient is scheduled for Mammogram today, 11/30/2023. Family h/o breast cancer: yes three paternal aunts. Last colonoscopy: November 2023. Results were: normal. Family h/o colorectal cancer: no     11/30/2023   10:06 AM 03/25/2021    1:45 PM  Depression screen PHQ 2/9  Decreased Interest 0 0  Down, Depressed, Hopeless 0 0  PHQ - 2 Score 0 0    Review of Systems:   Pertinent items are noted in HPI Denies any pelvic pain, bowel changes or urinary changes.   Pertinent History Reviewed:  Reviewed past medical,surgical, social and family history.  Reviewed problem list, medications and allergies. Physical Assessment:   Vitals:   11/30/23 1000  BP: 129/76  Pulse: 95  SpO2: 99%  Weight: 150 lb 12.8 oz (68.4 kg)  Height: 5' (1.524 m)  Body mass index is 29.45 kg/m.        Physical Examination:   General appearance - well appearing, and in no distress  Mental status - alert, oriented to person, place, and time  Psych:  She has a normal  mood and affect  Skin - warm and dry, normal color, no suspicious lesions noted  Chest - effort normal, all lung fields clear to auscultation bilaterally  Heart - normal rate and regular rhythm, systolic murmur 3/6  Neck:  midline trachea, no thyromegaly or nodules  Breasts - breasts appear normal, no suspicious masses, no skin or nipple changes or  axillary nodes  Abdomen - soft, nontender, nondistended, no masses or organomegaly  Pelvic - VULVA: normal appearing vulva with no masses, tenderness or lesions   VAGINA: atrophy, no discharge, no lesions   CERVIX: normal appearing cervix without discharge or lesions, no CMT  Thin prep pap is updated today  UTERUS: uterus is felt to be normal size, shape, consistency and nontender   ADNEXA: No adnexal masses or tenderness noted.  Rectal - normal rectal, good sphincter tone, no masses felt  Extremities:  No swelling or varicosities noted  Chaperone present for exam  No results found for this or any previous visit (from the past 24 hours).  Assessment & Plan:  1. Encntr for gyn exam (general) (routine) w/o abn findings (Primary) - Pap smear updated today - Mammogram 06/22/22.  Has MMG scheduled today. - Colonoscopy 12/2021 - lab work done with PCP - vaccines reviewed/updated  2. Cervical cancer screening - PR OBTAINING SCREEN PAP SMEAR - Cytology - PAP( [place])  3. History of  DVT (deep vein thrombosis) - on baby ASA  4. History of pulmonary embolism    Orders Placed This Encounter  Procedures   PR OBTAINING SCREEN PAP SMEAR    Meds: No orders of the defined types were placed in this encounter.   Follow-up: Return in about 2 months (around 01/30/2024).  Ronal GORMAN Pinal, MD 12/02/2023 4:43 PM

## 2023-12-01 LAB — CYTOLOGY - PAP: Diagnosis: NEGATIVE

## 2023-12-02 ENCOUNTER — Ambulatory Visit (HOSPITAL_BASED_OUTPATIENT_CLINIC_OR_DEPARTMENT_OTHER): Payer: Self-pay | Admitting: Obstetrics & Gynecology
